# Patient Record
Sex: Male | Born: 1995 | Race: Black or African American | Hispanic: No | Smoking: Current every day smoker
Health system: Southern US, Community
[De-identification: ages and names within clinical notes are randomized; demographics above are authoritative.]

## PROBLEM LIST (undated history)

## (undated) DIAGNOSIS — G43909 Migraine, unspecified, not intractable, without status migrainosus: Secondary | ICD-10-CM

## (undated) DIAGNOSIS — K8020 Calculus of gallbladder without cholecystitis without obstruction: Secondary | ICD-10-CM

## (undated) HISTORY — PX: NO PAST SURGERIES: SHX2092

---
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
## 2015-04-27 ENCOUNTER — Encounter: Payer: Self-pay | Admitting: *Deleted

## 2015-04-27 ENCOUNTER — Emergency Department (INDEPENDENT_AMBULATORY_CARE_PROVIDER_SITE_OTHER)
Admission: EM | Admit: 2015-04-27 | Discharge: 2015-04-27 | Disposition: A | Discharge status: Home / Self Care | Payer: Self-pay | Source: Home / Self Care | Attending: Family Medicine | Admitting: Family Medicine

## 2015-04-27 ENCOUNTER — Emergency Department (INDEPENDENT_AMBULATORY_CARE_PROVIDER_SITE_OTHER): Payer: Self-pay

## 2015-04-27 DIAGNOSIS — G43001 Migraine without aura, not intractable, with status migrainosus: Secondary | ICD-10-CM

## 2015-04-27 DIAGNOSIS — G43909 Migraine, unspecified, not intractable, without status migrainosus: Secondary | ICD-10-CM

## 2015-04-27 HISTORY — DX: Migraine, unspecified, not intractable, without status migrainosus: G43.909

## 2015-04-27 MED ORDER — KETOROLAC TROMETHAMINE 60 MG/2ML IM SOLN
60.0000 mg | Freq: Once | INTRAMUSCULAR | Status: AC
  Administered 2015-04-27: 60 mg via INTRAMUSCULAR

## 2015-04-27 MED ORDER — DEXAMETHASONE SODIUM PHOSPHATE 10 MG/ML IJ SOLN
10.0000 mg | Freq: Once | INTRAMUSCULAR | Status: AC
  Administered 2015-04-27: 10 mg via INTRAMUSCULAR

## 2015-04-27 NOTE — ED Provider Notes (Signed)
CSN: [id number]     Arrival date & time 04/27/15  1044 History   First MD Initiated Contact with Patient 04/27/15 1240     Chief Complaint  Patient presents with  . Headache      HPI Comments: Patient complains of a two week history of constant severe right side headache.  The headache awakens him at night, and he states that he has not slept for 28 hours.  He has had vague right arm intermittent tingling and blurred vision.  He has had nausea without vomiting.  He has taken Excedrin migraine without improvement.  No fevers, chills, and sweats.  Patient states that he has a history of "migraines," although he has never had a headache evaluation.  His usual headaches occur 2 to 3 times per month and only last about two hours, resolving spontaneously. No family history of migraines.  His mother has MS  Patient is a 19 y.o. male presenting with headaches. The history is provided by the patient.  Headache Pain location:  R parietal Quality:  Dull Radiates to:  R shoulder Onset quality:  Sudden Duration:  2 weeks Timing:  Constant Progression:  Unchanged Chronicity:  Recurrent Similar to prior headaches: yes   Context: activity and bright light   Relieved by:  Nothing Worsened by:  Light and activity Ineffective treatments: Excedrin Migraine tabs. Associated symptoms: blurred vision, nausea, paresthesias, photophobia and tingling   Associated symptoms: no abdominal pain, no congestion, no dizziness, no drainage, no ear pain, no eye pain, no facial pain, no fatigue, no fever, no focal weakness, no hearing loss, no loss of balance, no myalgias, no near-syncope, no neck pain, no neck stiffness, no numbness, no sinus pressure, no sore throat, no swollen glands, no syncope, no URI, no visual change, no vomiting and no weakness     Past Medical History  Diagnosis Date  . Migraine    History reviewed. No pertinent past surgical history. Family History  Problem Relation Age of Onset  .  Multiple sclerosis Mother   . Thyroid disease Mother    History  Substance Use Topics  . Smoking status: Never Smoker   . Smokeless tobacco: Not on file  . Alcohol Use: No    Review of Systems  Constitutional: Negative for fever and fatigue.  HENT: Negative for congestion, ear pain, hearing loss, postnasal drip, sinus pressure and sore throat.   Eyes: Positive for blurred vision and photophobia. Negative for pain.  Cardiovascular: Negative for syncope and near-syncope.  Gastrointestinal: Positive for nausea. Negative for vomiting and abdominal pain.  Musculoskeletal: Negative for myalgias, neck pain and neck stiffness.  Neurological: Positive for headaches and paresthesias. Negative for dizziness, focal weakness, weakness, numbness and loss of balance.  All other systems reviewed and are negative.   Allergies  Review of patient's allergies indicates no known allergies.  Home Medications   Prior to Admission medications   Medication Sig Start Date End Date Taking? Authorizing Provider  Aspirin-Acetaminophen-Caffeine (EXCEDRIN PO) Take by mouth.   Yes Historical Provider, MD  ibuprofen (ADVIL,MOTRIN) 200 MG tablet Take 200 mg by mouth every 6 (six) hours as needed.   Yes Historical Provider, MD   BP 136/83 mmHg  Pulse 69  Temp(Src) 97.7 F (36.5 C) (Oral)  Resp 18  Ht  (1.753 m)  Wt 195 lb (88.451 kg)  BMI 28.78 kg/m2  SpO2 99% Physical Exam Nursing notes and Vital Signs reviewed. Appearance:  Patient appears stated age, and in no acute distress  sitting in darkened room.  He is alert and oriented. Eyes:  Pupils are equal, round, and reactive to light and accomodation.  Extraocular movement is intact.  Conjunctivae are not inflamed.  Fundi benign.  Photophobia present. Ears:  Canals normal.  Tympanic membranes normal.  No TMJ tenderness Nose:  Mildly congested turbinates.  No sinus tenderness.  Pharynx:  Normal; tongue midline Neck:  Supple.  No adenopathy or  thyromegaly. Lungs:  Clear to auscultation.  Breath sounds are equal.  Heart:  Regular rate and rhythm without murmurs, rubs, or gallops.  Abdomen:  Nontender without masses or hepatosplenomegaly.  Bowel sounds are present.  No CVA or flank tenderness.  Extremities:  No edema.  No calf tenderness Skin:  No rash present. Neurologic:  Cranial nerves 2 through 12 are normal.  Patellar, achilles, and elbow reflexes are normal.  Cerebellar function is intact (finger-to-nose and rapid alternating hand movement).  Gait and station are normal.  Romberg positive.    ED Course  Procedures  none    Imaging Review Ct Head Wo Contrast  04/27/2015   CLINICAL DATA:  Migraine for 2-3 weeks.  Blurred vision.  EXAM: CT HEAD WITHOUT CONTRAST  TECHNIQUE: Contiguous axial images were obtained from the base of the skull through the vertex without intravenous contrast.  COMPARISON:  None.  FINDINGS: No acute intracranial abnormality. Specifically, no hemorrhage, hydrocephalus, mass lesion, acute infarction, or significant intracranial injury. No acute calvarial abnormality. Visualized paranasal sinuses and mastoids clear. Orbital soft tissues unremarkable.  IMPRESSION: Negative.   Electronically Signed   By: Charlett NoseKevin  Dover M.D.   On: 04/27/2015 13:28     MDM   1. Migraine without aura and with status migrainosus, not intractable    Toradol 60mg  IM, and Decadron 10mg  IM Followup with PCP for headache management.    Lattie HawStephen A Beese, MD 04/27/15 [phone]

## 2015-04-27 NOTE — Discharge Instructions (Signed)
Recurrent Migraine Headache °A migraine headache is very bad, throbbing pain on one or both sides of your head. Recurrent migraines keep coming back. Talk to your doctor about what things may bring on (trigger) your migraine headaches. °HOME CARE °· Only take medicines as told by your doctor. °· Lie down in a dark, quiet room when you have a migraine. °· Keep a journal to find out if certain things bring on migraine headaches. For example, write down: °¨ What you eat and drink. °¨ How much sleep you get. °¨ Any change to your diet or medicines. °· Lessen how much alcohol you drink. °· Quit smoking if you smoke. °· Get enough sleep. °· Lessen any stress in your life. °· Keep lights dim if bright lights bother you or make your migraines worse. °GET HELP IF: °· Medicine does not help your migraines. °· Your pain keeps coming back. °· You have a fever. °GET HELP RIGHT AWAY IF:  °· Your migraine becomes really bad. °· You have a stiff neck. °· You have trouble seeing. °· Your muscles are weak, or you lose muscle control. °· You lose your balance or have trouble walking. °· You feel like you will pass out (faint), or you pass out. °· You have really bad symptoms that are different than your first symptoms. °MAKE SURE YOU:  °· Understand these instructions. °· Will watch your condition. °· Will get help right away if you are not doing well or get worse. °Document Released: 07/24/2008 Document Revised: 10/20/2013 Document Reviewed: 06/22/2013 °Exit[employer]® Patient Information ©2015 [employer]. This information is not intended to replace advice given to you by your health care provider. Make sure you discuss any questions you have with your health care provider. ° °

## 2015-04-27 NOTE — ED Notes (Signed)
Pt c/o HA x 2 wks. Reports history of migraines. He also c/o RT arm numbness and blurry vision in his eyes bilaterally x 1 wk. He reports that he has not slept in 28 hours due to HA. Denies any recent head trauma.

## 2015-10-25 ENCOUNTER — Encounter (HOSPITAL_COMMUNITY): Payer: Self-pay | Admitting: Emergency Medicine

## 2015-10-25 ENCOUNTER — Emergency Department (HOSPITAL_COMMUNITY)
Admission: EM | Admit: 2015-10-25 | Discharge: 2015-10-25 | Disposition: A | Discharge status: Home / Self Care | Payer: Self-pay | Attending: Emergency Medicine | Admitting: Emergency Medicine

## 2015-10-25 DIAGNOSIS — S0101XA Laceration without foreign body of scalp, initial encounter: Secondary | ICD-10-CM | POA: Insufficient documentation

## 2015-10-25 DIAGNOSIS — Y998 Other external cause status: Secondary | ICD-10-CM | POA: Insufficient documentation

## 2015-10-25 DIAGNOSIS — G43909 Migraine, unspecified, not intractable, without status migrainosus: Secondary | ICD-10-CM | POA: Insufficient documentation

## 2015-10-25 DIAGNOSIS — S0191XA Laceration without foreign body of unspecified part of head, initial encounter: Secondary | ICD-10-CM

## 2015-10-25 DIAGNOSIS — Y9289 Other specified places as the place of occurrence of the external cause: Secondary | ICD-10-CM | POA: Insufficient documentation

## 2015-10-25 DIAGNOSIS — Y9301 Activity, walking, marching and hiking: Secondary | ICD-10-CM | POA: Insufficient documentation

## 2015-10-25 MED ORDER — IBUPROFEN 200 MG PO TABS
600.0000 mg | ORAL_TABLET | Freq: Once | ORAL | Status: AC
  Administered 2015-10-25: 600 mg via ORAL
  Filled 2015-10-25: qty 1

## 2015-10-25 MED ORDER — LIDOCAINE-EPINEPHRINE-TETRACAINE (LET) SOLUTION
3.0000 mL | Freq: Once | NASAL | Status: AC
  Administered 2015-10-25: 3 mL via TOPICAL
  Filled 2015-10-25: qty 3

## 2015-10-25 MED ORDER — LIDOCAINE-EPINEPHRINE (PF) 2 %-1:200000 IJ SOLN
10.0000 mL | Freq: Once | INTRAMUSCULAR | Status: AC
  Administered 2015-10-25: 10 mL
  Filled 2015-10-25: qty 20
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
## 2015-10-25 NOTE — Discharge Instructions (Signed)
Read the information below.  You may return to the Emergency Department at any time for worsening condition or any new symptoms that concern you.  Please go to your doctor, the urgent care, or the emergency department in 10 days for a wound check and staple removal.  If you develop redness, swelling, pus draining from the wound, or fevers greater than 100.4, return to the ER immediately for a recheck.     You have had a head injury which does not appear to require admission at this time. A concussion is a state of changed mental ability from trauma. SEEK IMMEDIATE MEDICAL ATTENTION IF: There is confusion or drowsiness (although children frequently become drowsy after injury).  You cannot awaken the injured person.  There is nausea (feeling sick to your stomach) or continued, forceful vomiting.  You notice dizziness or unsteadiness which is getting worse, or inability to walk.  You have convulsions or unconsciousness.  You experience severe, persistent headaches not relieved by Tylenol?. (Do not take aspirin as this impairs clotting abilities). Take other pain medications only as directed.  You cannot use arms or legs normally.  There are changes in pupil sizes. (This is the black center in the colored part of the eye)  There is clear or bloody discharge from the nose or ears.  Change in speech, vision, swallowing, or understanding.  Localized weakness, numbness, tingling, or change in bowel or bladder control.   Head Injury, Adult You have a head injury. Headaches and throwing up (vomiting) are common after a head injury. It should be easy to wake up from sleeping. Sometimes you must stay in the hospital. Most problems happen within the first 24 hours. Side effects may occur up to 7-10 days after the injury.  WHAT ARE THE TYPES OF HEAD INJURIES? Head injuries can be as minor as a bump. Some head injuries can be more severe. More severe head injuries include:  A jarring injury to the brain  (concussion).  A bruise of the brain (contusion). This mean there is bleeding in the brain that can cause swelling.  A cracked skull (skull fracture).  Bleeding in the brain that collects, clots, and forms a bump (hematoma). WHEN SHOULD I GET HELP RIGHT AWAY?   You are confused or sleepy.  You cannot be woken up.  You feel sick to your stomach (nauseous) or keep throwing up (vomiting).  Your dizziness or unsteadiness is getting worse.  You have very bad, lasting headaches that are not helped by medicine. Take medicines only as told by your doctor.  You cannot use your arms or legs like normal.  You cannot walk.  You notice changes in the black spots in the center of the colored part of your eye (pupil).  You have clear or bloody fluid coming from your nose or ears.  You have trouble seeing. During the next 24 hours after the injury, you must stay with someone who can watch you. This person should get help right away (call 911 in the [place] if you start to shake and are not able to control it (have seizures), you pass out, or you are unable to wake up. HOW CAN I PREVENT A HEAD INJURY IN THE FUTURE?  Wear seat belts.  Wear a helmet while bike riding and playing sports like football.  Stay away from dangerous activities around the house. WHEN CAN I RETURN TO NORMAL ACTIVITIES AND ATHLETICS? See your doctor before doing these activities. You should not do normal activities  or play contact sports until 1 week after the following symptoms have stopped:  Headache that does not go away.  Dizziness.  Poor attention.  Confusion.  Memory problems.  Sickness to your stomach or throwing up.  Tiredness.  Fussiness.  Bothered by bright lights or loud noises.  Anxiousness or depression.  Restless sleep. MAKE SURE YOU:   Understand these instructions.  Will watch your condition.  Will get help right away if you are not doing well or get worse.   This information is  not intended to replace advice given to you by your health care provider. Make sure you discuss any questions you have with your health care provider.   Document Released: 09/27/2008 Document Revised: 11/05/2014 Document Reviewed: 06/22/2013 Elsevier Interactive Patient Education 2016 [employer].  Stitches, [place], or Adhesive Wound Closure Doctors use stitches (sutures), staples, and certain glue (skin adhesives) to hold your skin together while it heals (wound closure). You may need this treatment after you have surgery or if you cut your skin accidentally. These methods help your skin heal more quickly. They also make it less likely that you will have a scar. WHAT ARE THE DIFFERENT KINDS OF WOUND CLOSURES? There are many options for wound closure. The one that your doctor uses depends on how deep and large your wound is. Adhesive Glue To use this glue to close a wound, your doctor holds the edges of the wound together and paints the glue on the surface of your skin. You may need more than one layer of glue. Then the wound may be covered with a light bandage (dressing). This type of skin closure may be used for small wounds that are not deep (superficial). Using glue for wound closure is less painful than other methods. It does not require a medicine that numbs the area. This method also leaves nothing to be removed. Adhesive glue is often used for children and on facial wounds. Adhesive glue cannot be used for wounds that are deep, uneven, or bleeding. It is not used inside of a wound.  Adhesive Strips These strips are made of sticky (adhesive), porous paper. They are placed across your skin edges like a regular adhesive bandage. You leave them on until they fall off. Adhesive strips may be used to close very superficial wounds. They may also be used along with sutures to improve closure of your skin edges.  Sutures Sutures are the oldest method of wound closure. Sutures can be made from  natural or synthetic materials. They can be made from a material that your body can break down as your wound heals (absorbable), or they can be made from a material that needs to be removed from your skin (nonabsorbable). They come in many different strengths and sizes. Your doctor attaches the sutures to a steel needle on one end. Sutures can be passed through your skin, or through the tissues beneath your skin. Then they are tied and cut. Your skin edges may be closed in one continuous stitch or in separate stitches. Sutures are strong and can be used for all kinds of wounds. Absorbable sutures may be used to close tissues under the skin. The disadvantage of sutures is that they may cause skin reactions that lead to infection. Nonabsorbable sutures need to be removed. Staples When surgical staples are used to close a wound, the edges of your skin on both sides of the wound are brought close together. A staple is placed across the wound, and an instrument secures  the edges together. Staples are often used to close surgical cuts (incisions). Staples are faster to use than sutures, and they cause less reaction from your skin. Staples need to be removed using a tool that bends the staples away from your skin. HOW DO I CARE FOR MY WOUND CLOSURE?  Take medicines only as told by your doctor.  If you were prescribed an antibiotic medicine for your wound, finish it all even if you start to feel better.  Use ointments or creams only as told by your doctor.  Wash your hands with soap and water before and after touching your wound.  Do not soak your wound in water. Do not take baths, swim, or use a hot tub until your doctor says it is okay.  Ask your doctor when you can start showering. Cover your wound if told by your doctor.  Do not take out your own sutures or staples.  Do not pick at your wound. Picking can cause an infection.  Keep all follow-up visits as told by your doctor. This is  important. HOW LONG WILL I HAVE MY WOUND CLOSURE?   Leave adhesive glue on your skin until the glue peels away.  Leave adhesive strips on your skin until they fall off.  Absorbable sutures will dissolve within several days.  Nonabsorbable sutures and staples must be removed. The location of the wound will determine how long they stay in. This can range from several days to a couple of weeks. WHEN SHOULD I SEEK HELP FOR MY WOUND CLOSURE? Contact your doctor if:  You have a fever.  You have chills.  You have redness, puffiness (swelling), or pain at the site of your wound.  You have fluid, blood, or pus coming from your wound.  There is a bad smell coming from your wound.  The skin edges of your wound start to separate after your sutures have been removed.  Your wound becomes thick, raised, and darker in color after your sutures come out (scarring).   This information is not intended to replace advice given to you by your health care provider. Make sure you discuss any questions you have with your health care provider.   Document Released: 08/12/2009 Document Revised: 11/05/2014 Document Reviewed: 03/24/2014 Elsevier Interactive Patient Education [employer]

## 2015-11-26 ENCOUNTER — Encounter (HOSPITAL_COMMUNITY): Payer: Self-pay | Admitting: Emergency Medicine

## 2015-11-26 ENCOUNTER — Emergency Department (HOSPITAL_COMMUNITY)
Admission: EM | Admit: 2015-11-26 | Discharge: 2015-11-26 | Disposition: A | Discharge status: Home / Self Care | Payer: Self-pay | Attending: Emergency Medicine | Admitting: Emergency Medicine

## 2015-11-26 DIAGNOSIS — Z7982 Long term (current) use of aspirin: Secondary | ICD-10-CM | POA: Insufficient documentation

## 2015-11-26 DIAGNOSIS — Z4802 Encounter for removal of sutures: Secondary | ICD-10-CM | POA: Insufficient documentation

## 2015-11-26 DIAGNOSIS — G43909 Migraine, unspecified, not intractable, without status migrainosus: Secondary | ICD-10-CM | POA: Insufficient documentation

## 2015-11-26 NOTE — ED Provider Notes (Signed)
CSN: [id number]     Arrival date & time 11/26/15  1135 History  By signing my name below, I, Phillis Haggis, attest that this documentation has been prepared under the direction and in the presence of Cheri Fowler, PA-C. Electronically Signed: Phillis Haggis, ED Scribe. 11/26/2015. 12:34 PM.  Chief Complaint  Patient presents with  . Suture / Staple Removal   The history is provided by the patient. No language interpreter was used.  HPI Comments: Willie Meyer is a 20 y.o. male who presents to the Emergency Department requesting a staple removal. Pt states that he has 5 staples in his head that were supposed to be removed two weeks ago. He reports one fell out yesterday.  Pt states that he did not get them removed because of the inclement weather. He denies fever, chills, nausea, or vomiting.  Past Medical History  Diagnosis Date  . Migraine    History reviewed. No pertinent past surgical history. Family History  Problem Relation Age of Onset  . Multiple sclerosis Mother   . Thyroid disease Mother    Social History  Substance Use Topics  . Smoking status: Never Smoker   . Smokeless tobacco: None  . Alcohol Use: No    Review of Systems  Constitutional: Negative for fever and chills.  Gastrointestinal: Negative for nausea and vomiting.  Skin: Positive for wound.  All other systems reviewed and are negative.  Allergies  Review of patient's allergies indicates no known allergies.  Home Medications   Prior to Admission medications   Medication Sig Start Date End Date Taking? Authorizing Provider  Aspirin-Acetaminophen-Caffeine (EXCEDRIN PO) Take by mouth.    Historical Provider, MD  ibuprofen (ADVIL,MOTRIN) 200 MG tablet Take 200 mg by mouth every 6 (six) hours as needed.    Historical Provider, MD   BP 105/89 mmHg  Pulse 80  Temp(Src) 97.7 F (36.5 C)  Resp 20  Ht  (1.753 m)  Wt 88.905 kg  BMI 28.93 kg/m2  SpO2 98% Physical Exam  Constitutional: He is  oriented to person, place, and time. He appears well-developed and well-nourished.  HENT:  Head: Atraumatic.  Eyes: Conjunctivae are normal. No scleral icterus.  Neck: No tracheal deviation present.  Pulmonary/Chest: Effort normal. No respiratory distress.  Abdominal: He exhibits no distension.  Musculoskeletal: Normal range of motion.  Neurological: He is alert and oriented to person, place, and time.  Skin: Skin is warm and dry. No erythema.  Well appearing and healing wound with 4 staples in place along left parietal scalp without tenderness, erythema, or drainage.    Psychiatric: He has a normal mood and affect. His behavior is normal.    ED Course  Procedures (including critical care time) DIAGNOSTIC STUDIES: Oxygen Saturation is 98% on RA, normal by my interpretation.    COORDINATION OF CARE: 12:32 PM-Discussed treatment plan which includes staple removal with pt at bedside and pt agreed to plan.   STAPLE REMOVAL Performed by: Cheri Fowler, PA-C Authorized by: Arby Barrette, MD Consent: Verbal consent obtained. Consent given by: patient Required items: required blood products, implants, devices, and special equipment available  Time out: Immediately prior to procedure a "time out" was called to verify the correct patient, procedure, equipment, support staff and site/side marked as required. Location: left parietal scalp Wound Appearance: clean  Staples Removed: 4 Post-removal: none Patient tolerance: Patient tolerated the procedure well with no immediate complications.    Labs Review Labs Reviewed - No data to display  Imaging Review  No results found. I have personally reviewed and evaluated these images and lab results as part of my medical decision-making.   EKG Interpretation None      MDM  Staple removal   Pt to ER for staple/suture removal and wound check as above. Procedure tolerated well. Vitals normal, no signs of infection. Scar minimization & return  precautions given at dc.   Final diagnoses:  Encounter for staple removal    I personally performed the services described in this documentation, which was scribed in my presence. The recorded information has been reviewed and is accurate.    Cheri Fowler, PA-C 11/26/15 1238  Arby Barrette, MD 12/04/15 [phone]

## 2015-11-26 NOTE — Discharge Instructions (Signed)

## 2015-11-26 NOTE — ED Notes (Signed)
Pt here for staple removal in head. Pt reports that they were supposed to be removed 2 weeks ago.

## 2016-05-05 ENCOUNTER — Emergency Department (HOSPITAL_COMMUNITY)
Admission: EM | Admit: 2016-05-05 | Discharge: 2016-05-05 | Disposition: A | Discharge status: Home / Self Care | Payer: Self-pay | Attending: Dermatology | Admitting: Dermatology

## 2016-05-05 ENCOUNTER — Encounter (HOSPITAL_COMMUNITY): Payer: Self-pay

## 2016-05-05 DIAGNOSIS — Z5321 Procedure and treatment not carried out due to patient leaving prior to being seen by health care provider: Secondary | ICD-10-CM | POA: Insufficient documentation

## 2016-05-05 DIAGNOSIS — R531 Weakness: Secondary | ICD-10-CM | POA: Insufficient documentation

## 2016-05-05 LAB — CBC
HCT: 49.1 % (ref 39.0–52.0)
HEMOGLOBIN: 17.1 g/dL — AB (ref 13.0–17.0)
MCH: 30.3 pg (ref 26.0–34.0)
MCHC: 34.8 g/dL (ref 30.0–36.0)
MCV: 86.9 fL (ref 78.0–100.0)
Platelets: 265 10*3/uL (ref 150–400)
RBC: 5.65 MIL/uL (ref 4.22–5.81)
RDW: 12.8 % (ref 11.5–15.5)
WBC: 5.5 10*3/uL (ref 4.0–10.5)

## 2016-05-05 LAB — BASIC METABOLIC PANEL
ANION GAP: 7 (ref 5–15)
BUN: 8 mg/dL (ref 6–20)
CALCIUM: 9.8 mg/dL (ref 8.9–10.3)
CHLORIDE: 102 mmol/L (ref 101–111)
CO2: 28 mmol/L (ref 22–32)
Creatinine, Ser: 1.11 mg/dL (ref 0.61–1.24)
GFR calc Af Amer: >60 mL/min (ref >60)
GFR calc non Af Amer: >60 mL/min (ref >60)
GLUCOSE: 102 mg/dL — AB (ref 65–99)
POTASSIUM: 4.3 mmol/L (ref 3.5–5.1)
Sodium: 137 mmol/L (ref 135–145)

## 2016-05-05 NOTE — ED Notes (Signed)
Called for pt. X2 , pt. Did not answer

## 2016-05-05 NOTE — ED Notes (Addendum)
Patient here with intermittent weakness, headaches and fatigue for 1 year. Has had CT for same and was told negative. Patient concerned because his mother was diagnosed with MS at an early age, no neuro deficits on arrival. [facility] that he has migra[facility].

## 2016-08-29 DIAGNOSIS — K8020 Calculus of gallbladder without cholecystitis without obstruction: Secondary | ICD-10-CM

## 2016-08-29 HISTORY — DX: Calculus of gallbladder without cholecystitis without obstruction: K80.20

## 2016-09-17 ENCOUNTER — Observation Stay (HOSPITAL_COMMUNITY): Payer: Self-pay | Admitting: Certified Registered Nurse Anesthetist

## 2016-09-17 ENCOUNTER — Encounter (HOSPITAL_COMMUNITY): Payer: Self-pay | Admitting: Emergency Medicine

## 2016-09-17 ENCOUNTER — Inpatient Hospital Stay (HOSPITAL_COMMUNITY)
Admission: EM | Admit: 2016-09-17 | Discharge: 2016-09-19 | DRG: 419 | Disposition: A | Discharge status: Home / Self Care | Payer: Self-pay | Attending: Surgery | Admitting: Surgery

## 2016-09-17 ENCOUNTER — Emergency Department (HOSPITAL_COMMUNITY): Payer: Self-pay

## 2016-09-17 ENCOUNTER — Encounter (HOSPITAL_COMMUNITY): Admission: EM | Disposition: A | Discharge status: Home / Self Care | Payer: Self-pay | Source: Home / Self Care

## 2016-09-17 DIAGNOSIS — K819 Cholecystitis, unspecified: Secondary | ICD-10-CM

## 2016-09-17 DIAGNOSIS — F17200 Nicotine dependence, unspecified, uncomplicated: Secondary | ICD-10-CM | POA: Diagnosis present

## 2016-09-17 DIAGNOSIS — K8010 Calculus of gallbladder with chronic cholecystitis without obstruction: Secondary | ICD-10-CM | POA: Diagnosis present

## 2016-09-17 DIAGNOSIS — Z8349 Family history of other endocrine, nutritional and metabolic diseases: Secondary | ICD-10-CM

## 2016-09-17 DIAGNOSIS — K8012 Calculus of gallbladder with acute and chronic cholecystitis without obstruction: Principal | ICD-10-CM | POA: Diagnosis present

## 2016-09-17 DIAGNOSIS — R1011 Right upper quadrant pain: Secondary | ICD-10-CM

## 2016-09-17 DIAGNOSIS — Z820 Family history of epilepsy and other diseases of the nervous system: Secondary | ICD-10-CM

## 2016-09-17 HISTORY — DX: Calculus of gallbladder without cholecystitis without obstruction: K80.20

## 2016-09-17 HISTORY — PX: CHOLECYSTECTOMY: SHX55

## 2016-09-17 LAB — COMPREHENSIVE METABOLIC PANEL
ALBUMIN: 3.8 g/dL (ref 3.5–5.0)
ALK PHOS: 88 U/L (ref 38–126)
ALT: 95 U/L — AB (ref 17–63)
ANION GAP: 7 (ref 5–15)
AST: 46 U/L — AB (ref 15–41)
BUN: 8 mg/dL (ref 6–20)
CALCIUM: 8.9 mg/dL (ref 8.9–10.3)
CO2: 28 mmol/L (ref 22–32)
CREATININE: 1.1 mg/dL (ref 0.61–1.24)
Chloride: 105 mmol/L (ref 101–111)
GFR calc Af Amer: >60 mL/min (ref >60)
GFR calc non Af Amer: >60 mL/min (ref >60)
GLUCOSE: 100 mg/dL — AB (ref 65–99)
Potassium: 3.6 mmol/L (ref 3.5–5.1)
SODIUM: 140 mmol/L (ref 135–145)
Total Bilirubin: 0.5 mg/dL (ref 0.3–1.2)
Total Protein: 6.8 g/dL (ref 6.5–8.1)

## 2016-09-17 LAB — URINALYSIS, ROUTINE W REFLEX MICROSCOPIC
BILIRUBIN URINE: NEGATIVE
Glucose, UA: NEGATIVE mg/dL
HGB URINE DIPSTICK: NEGATIVE
Ketones, ur: NEGATIVE mg/dL
Leukocytes, UA: NEGATIVE
Nitrite: NEGATIVE
PH: 6 (ref 5.0–8.0)
Protein, ur: NEGATIVE mg/dL
SPECIFIC GRAVITY, URINE: 1.024 (ref 1.005–1.030)

## 2016-09-17 LAB — CBC
HCT: 45.2 % (ref 39.0–52.0)
HEMOGLOBIN: 15.4 g/dL (ref 13.0–17.0)
MCH: 30.3 pg (ref 26.0–34.0)
MCHC: 34.1 g/dL (ref 30.0–36.0)
MCV: 88.8 fL (ref 78.0–100.0)
Platelets: 231 10*3/uL (ref 150–400)
RBC: 5.09 MIL/uL (ref 4.22–5.81)
RDW: 13.6 % (ref 11.5–15.5)
WBC: 6.7 10*3/uL (ref 4.0–10.5)

## 2016-09-17 LAB — SURGICAL PCR SCREEN
MRSA, PCR: NEGATIVE
STAPHYLOCOCCUS AUREUS: NEGATIVE

## 2016-09-17 LAB — LIPASE, BLOOD: Lipase: 23 U/L (ref 11–51)

## 2016-09-17 SURGERY — LAPAROSCOPIC CHOLECYSTECTOMY WITH INTRAOPERATIVE CHOLANGIOGRAM
Anesthesia: General | Site: Abdomen

## 2016-09-17 MED ORDER — PROPOFOL 10 MG/ML IV BOLUS
INTRAVENOUS | Status: AC
Start: 2016-09-17 — End: 2016-09-17
  Filled 2016-09-17: qty 20

## 2016-09-17 MED ORDER — DEXTROSE 5 % IV SOLN
2.0000 g | INTRAVENOUS | Status: DC
  Administered 2016-09-17 – 2016-09-18 (×2): 2 g via INTRAVENOUS
  Filled 2016-09-17 (×2): qty 2

## 2016-09-17 MED ORDER — ONDANSETRON HCL 4 MG/2ML IJ SOLN
INTRAMUSCULAR | Status: AC
  Filled 2016-09-17: qty 4

## 2016-09-17 MED ORDER — NEOSTIGMINE METHYLSULFATE 10 MG/10ML IV SOLN
INTRAVENOUS | Status: DC | PRN
  Administered 2016-09-17: 4 mg via INTRAVENOUS

## 2016-09-17 MED ORDER — HYDROMORPHONE HCL 1 MG/ML IJ SOLN
INTRAMUSCULAR | Status: AC
  Filled 2016-09-17: qty 0.5

## 2016-09-17 MED ORDER — GLYCOPYRROLATE 0.2 MG/ML IJ SOLN
INTRAMUSCULAR | Status: DC | PRN
  Administered 2016-09-17: .6 mg via INTRAVENOUS

## 2016-09-17 MED ORDER — SUCCINYLCHOLINE CHLORIDE 20 MG/ML IJ SOLN
INTRAMUSCULAR | Status: DC | PRN
  Administered 2016-09-17: 100 mg via INTRAVENOUS

## 2016-09-17 MED ORDER — PROPOFOL 10 MG/ML IV BOLUS
INTRAVENOUS | Status: DC | PRN
  Administered 2016-09-17: 200 mg via INTRAVENOUS

## 2016-09-17 MED ORDER — LIDOCAINE HCL (CARDIAC) 20 MG/ML IV SOLN
INTRAVENOUS | Status: DC | PRN
  Administered 2016-09-17: 100 mg via INTRAVENOUS

## 2016-09-17 MED ORDER — ROCURONIUM BROMIDE 100 MG/10ML IV SOLN
INTRAVENOUS | Status: DC | PRN
  Administered 2016-09-17: 10 mg via INTRAVENOUS
  Administered 2016-09-17: 30 mg via INTRAVENOUS

## 2016-09-17 MED ORDER — 0.9 % SODIUM CHLORIDE (POUR BTL) OPTIME
TOPICAL | Status: DC | PRN
  Administered 2016-09-17: 1000 mL

## 2016-09-17 MED ORDER — POTASSIUM CHLORIDE IN NACL 20-0.9 MEQ/L-% IV SOLN
INTRAVENOUS | Status: DC
  Administered 2016-09-17: 08:00:00 via INTRAVENOUS
  Filled 2016-09-17: qty 1000

## 2016-09-17 MED ORDER — DEXTROSE-NACL 5-0.9 % IV SOLN
INTRAVENOUS | Status: DC
  Administered 2016-09-17: 18:00:00 via INTRAVENOUS

## 2016-09-17 MED ORDER — ONDANSETRON HCL 4 MG/2ML IJ SOLN
INTRAMUSCULAR | Status: DC | PRN
  Administered 2016-09-17: 4 mg via INTRAVENOUS

## 2016-09-17 MED ORDER — ONDANSETRON HCL 4 MG/2ML IJ SOLN
4.0000 mg | Freq: Four times a day (QID) | INTRAMUSCULAR | Status: DC | PRN
  Administered 2016-09-19: 4 mg via INTRAVENOUS
  Filled 2016-09-17: qty 2

## 2016-09-17 MED ORDER — MIDAZOLAM HCL 2 MG/2ML IJ SOLN
INTRAMUSCULAR | Status: AC
  Filled 2016-09-17: qty 2

## 2016-09-17 MED ORDER — SODIUM CHLORIDE 0.9 % IR SOLN
Status: DC | PRN
  Administered 2016-09-17: 1000 mL

## 2016-09-17 MED ORDER — ONDANSETRON HCL 4 MG/2ML IJ SOLN: 4.0000 mg | Freq: Four times a day (QID) | INTRAMUSCULAR | Status: DC | PRN

## 2016-09-17 MED ORDER — BUPIVACAINE HCL (PF) 0.25 % IJ SOLN
INTRAMUSCULAR | Status: AC
Start: 2016-09-17 — End: 2016-09-17
  Filled 2016-09-17: qty 30

## 2016-09-17 MED ORDER — MIDAZOLAM HCL 5 MG/5ML IJ SOLN
INTRAMUSCULAR | Status: DC | PRN
  Administered 2016-09-17: 2 mg via INTRAVENOUS

## 2016-09-17 MED ORDER — FENTANYL CITRATE (PF) 100 MCG/2ML IJ SOLN
INTRAMUSCULAR | Status: DC | PRN
  Administered 2016-09-17: 100 ug via INTRAVENOUS
  Administered 2016-09-17: 50 ug via INTRAVENOUS

## 2016-09-17 MED ORDER — OXYCODONE HCL 5 MG PO TABS
ORAL_TABLET | ORAL | Status: AC
  Filled 2016-09-17: qty 2

## 2016-09-17 MED ORDER — MORPHINE SULFATE (PF) 4 MG/ML IV SOLN
4.0000 mg | Freq: Once | INTRAVENOUS | Status: AC
  Administered 2016-09-17: 4 mg via INTRAVENOUS
  Filled 2016-09-17: qty 1

## 2016-09-17 MED ORDER — OXYCODONE HCL 5 MG PO TABS
5.0000 mg | ORAL_TABLET | ORAL | Status: DC | PRN
  Administered 2016-09-17 – 2016-09-19 (×11): 10 mg via ORAL
  Filled 2016-09-17 (×10): qty 2

## 2016-09-17 MED ORDER — FENTANYL CITRATE (PF) 100 MCG/2ML IJ SOLN
INTRAMUSCULAR | Status: AC
  Filled 2016-09-17: qty 4

## 2016-09-17 MED ORDER — ENOXAPARIN SODIUM 40 MG/0.4ML ~~LOC~~ SOLN
40.0000 mg | SUBCUTANEOUS | Status: DC
  Administered 2016-09-18 – 2016-09-19 (×2): 40 mg via SUBCUTANEOUS
  Filled 2016-09-17 (×2): qty 0.4

## 2016-09-17 MED ORDER — BUPIVACAINE HCL 0.25 % IJ SOLN
INTRAMUSCULAR | Status: DC | PRN
  Administered 2016-09-17: 6 mL

## 2016-09-17 MED ORDER — ONDANSETRON HCL 4 MG/2ML IJ SOLN
4.0000 mg | Freq: Once | INTRAMUSCULAR | Status: AC
  Administered 2016-09-17: 4 mg via INTRAVENOUS
  Filled 2016-09-17: qty 2

## 2016-09-17 MED ORDER — HYDROMORPHONE HCL 2 MG/ML IJ SOLN
1.0000 mg | INTRAMUSCULAR | Status: DC | PRN
  Administered 2016-09-17 – 2016-09-18 (×4): 2 mg via INTRAVENOUS
  Filled 2016-09-17 (×4): qty 1

## 2016-09-17 MED ORDER — HYDROMORPHONE HCL 1 MG/ML IJ SOLN
0.2500 mg | INTRAMUSCULAR | Status: DC | PRN
  Administered 2016-09-17 (×2): 0.5 mg via INTRAVENOUS

## 2016-09-17 MED ORDER — ONDANSETRON 4 MG PO TBDP: 4.0000 mg | ORAL_TABLET | Freq: Four times a day (QID) | ORAL | Status: DC | PRN

## 2016-09-17 MED ORDER — PROPOFOL 10 MG/ML IV BOLUS
INTRAVENOUS | Status: AC
  Filled 2016-09-17: qty 20

## 2016-09-17 MED ORDER — HYDROMORPHONE HCL 2 MG/ML IJ SOLN
1.0000 mg | INTRAMUSCULAR | Status: DC | PRN
  Administered 2016-09-17 (×3): 1 mg via INTRAVENOUS
  Filled 2016-09-17 (×3): qty 1

## 2016-09-17 MED ORDER — LACTATED RINGERS IV SOLN
INTRAVENOUS | Status: DC
  Administered 2016-09-17 (×2): via INTRAVENOUS

## 2016-09-17 MED ORDER — SODIUM CHLORIDE 0.9 % IV BOLUS (SEPSIS)
1000.0000 mL | Freq: Once | INTRAVENOUS | Status: AC
  Administered 2016-09-17: 1000 mL via INTRAVENOUS

## 2016-09-17 SURGICAL SUPPLY — 50 items
APL SKNCLS STERI-STRIP NONHPOA (GAUZE/BANDAGES/DRESSINGS) ×1
APPLIER CLIP 5 13 M/L LIGAMAX5 (MISCELLANEOUS)
APR CLP MED LRG 5 ANG JAW (MISCELLANEOUS)
BAG SPEC RTRVL LRG 6X4 10 (ENDOMECHANICALS) ×1
BENZOIN TINCTURE PRP APPL 2/3 (GAUZE/BANDAGES/DRESSINGS) ×3 IMPLANT
CANISTER SUCTION 2500CC (MISCELLANEOUS) ×3 IMPLANT
CHLORAPREP W/TINT 26ML (MISCELLANEOUS) ×3 IMPLANT
CLIP APPLIE 5 13 M/L LIGAMAX5 (MISCELLANEOUS) IMPLANT
CLIP LIGATING HEMO O LOK GREEN (MISCELLANEOUS) ×3 IMPLANT
CLOSURE WOUND 1/2 X4 (GAUZE/BANDAGES/DRESSINGS) ×1
COVER MAYO STAND STRL (DRAPES) ×3 IMPLANT
COVER SURGICAL LIGHT HANDLE (MISCELLANEOUS) ×3 IMPLANT
COVER TRANSDUCER ULTRASND (DRAPES) IMPLANT
DEVICE TROCAR PUNCTURE CLOSURE (ENDOMECHANICALS) ×3 IMPLANT
DRAPE C-ARM 42X72 X-RAY (DRAPES) ×3 IMPLANT
ELECT REM PT RETURN 9FT ADLT (ELECTROSURGICAL) ×3
ELECTRODE REM PT RTRN 9FT ADLT (ELECTROSURGICAL) ×1 IMPLANT
GAUZE SPONGE 2X2 8PLY STRL LF (GAUZE/BANDAGES/DRESSINGS) ×1 IMPLANT
GLOVE BIO SURGEON STRL SZ7.5 (GLOVE) ×3 IMPLANT
GLOVE BIOGEL PI IND STRL 7.0 (GLOVE) IMPLANT
GLOVE BIOGEL PI INDICATOR 7.0 (GLOVE) ×6
GLOVE ECLIPSE 7.0 STRL STRAW (GLOVE) ×2 IMPLANT
GLOVE SURG SS PI 7.0 STRL IVOR (GLOVE) ×2 IMPLANT
GOWN STRL REUS W/ TWL LRG LVL3 (GOWN DISPOSABLE) ×2 IMPLANT
GOWN STRL REUS W/ TWL XL LVL3 (GOWN DISPOSABLE) ×1 IMPLANT
GOWN STRL REUS W/TWL LRG LVL3 (GOWN DISPOSABLE) ×9
GOWN STRL REUS W/TWL XL LVL3 (GOWN DISPOSABLE) ×3
IV CATH 14GX2 1/4 (CATHETERS) ×3 IMPLANT
KIT BASIN OR (CUSTOM PROCEDURE TRAY) ×3 IMPLANT
KIT ROOM TURNOVER OR (KITS) ×3 IMPLANT
NDL INSUFFLATION 14GA 120MM (NEEDLE) ×1 IMPLANT
NEEDLE INSUFFLATION 14GA 120MM (NEEDLE) ×3 IMPLANT
NS IRRIG 1000ML POUR BTL (IV SOLUTION) ×3 IMPLANT
PAD ARMBOARD 7.5X6 YLW CONV (MISCELLANEOUS) ×6 IMPLANT
POUCH SPECIMEN RETRIEVAL 10MM (ENDOMECHANICALS) ×2 IMPLANT
SCISSORS LAP 5X35 DISP (ENDOMECHANICALS) ×3 IMPLANT
SET CHOLANGIOGRAPHY FRANKLIN (SET/KITS/TRAYS/PACK) ×3 IMPLANT
SET IRRIG TUBING LAPAROSCOPIC (IRRIGATION / IRRIGATOR) ×3 IMPLANT
SLEEVE ENDOPATH XCEL 5M (ENDOMECHANICALS) ×3 IMPLANT
SPECIMEN JAR SMALL (MISCELLANEOUS) ×3 IMPLANT
SPONGE GAUZE 2X2 STER 10/PKG (GAUZE/BANDAGES/DRESSINGS) ×2
STRIP CLOSURE SKIN 1/2X4 (GAUZE/BANDAGES/DRESSINGS) ×2 IMPLANT
SUT MNCRL AB 3-0 PS2 18 (SUTURE) ×3 IMPLANT
TAPE CLOTH SURG 4X10 WHT LF (GAUZE/BANDAGES/DRESSINGS) ×2 IMPLANT
TOWEL OR 17X24 6PK STRL BLUE (TOWEL DISPOSABLE) ×3 IMPLANT
TOWEL OR 17X26 10 PK STRL BLUE (TOWEL DISPOSABLE) ×3 IMPLANT
TRAY LAPAROSCOPIC MC (CUSTOM PROCEDURE TRAY) ×3 IMPLANT
TROCAR XCEL NON-BLD 11X100MML (ENDOMECHANICALS) ×3 IMPLANT
TROCAR XCEL NON-BLD 5MMX100MML (ENDOMECHANICALS) ×6 IMPLANT
TUBING INSUFFLATION (TUBING) ×3 IMPLANT

## 2016-09-17 NOTE — Transfer of Care (Signed)
Immediate Anesthesia Transfer of Care Note  Patient: Willie Meyer  Procedure(s) Performed: Procedure(s): LAPAROSCOPIC CHOLECYSTECTOMY (N/A)  Patient Location: PACU  Anesthesia Type:General  Level of Consciousness: awake, alert , oriented and patient cooperative  Airway & Oxygen Therapy: Patient Spontanous Breathing  Post-op Assessment: Report given to RN and Post -op Vital signs reviewed and stable  Post vital signs: Reviewed and stable  Last Vitals:  Vitals:   09/17/16 0700 09/17/16 0819  BP: 117/65 104/62  Pulse: (!) 59 87  Resp:  17  Temp:  36.4 C    Last Pain:  Vitals:   09/17/16 1352  TempSrc:   PainSc: Asleep      Patients Stated Pain Goal: 3 (09/17/16 0817)  Complications: No apparent anesthesia complications

## 2016-09-17 NOTE — ED Notes (Signed)
Attempted report 

## 2016-09-17 NOTE — H&P (Signed)
Willie Meyer is an 20 y.o. male.   Chief Complaint: Abdominal pain HPI: This is a 20 year old gentleman who presents with epigastric abdominal pain. The patient reports that it started suddenly yesterday. It is now moved to the right upper quadrant. He had nausea but no vomiting. He has no previous history of similar abdominal pain. After presenting to the ER, he has had an ultrasound showing findings consistent with acute cholecystitis with cholelithiasis. He is otherwise healthy without complaints.  Past Medical History:  Diagnosis Date  . Migraine     History reviewed. No pertinent surgical history.  Family History  Problem Relation Age of Onset  . Multiple sclerosis Mother   . Thyroid disease Mother    Social History:  reports that he has been smoking.  He has never used smokeless tobacco. He reports that he drinks alcohol. He reports that he uses drugs, including Marijuana.  Allergies: No Known Allergies   (Not in a hospital admission)  Results for orders placed or performed during the hospital encounter of 09/17/16 (from the past 48 hour(s))  Lipase, blood     Status: None   Collection Time: 09/17/16 12:46 AM  Result Value Ref Range   Lipase 23 11 - 51 U/L  Comprehensive metabolic panel     Status: Abnormal   Collection Time: 09/17/16 12:46 AM  Result Value Ref Range   Sodium 140 135 - 145 mmol/L   Potassium 3.6 3.5 - 5.1 mmol/L   Chloride 105 101 - 111 mmol/L   CO2 28 22 - 32 mmol/L   Glucose, Bld 100 (H) 65 - 99 mg/dL   BUN 8 6 - 20 mg/dL   Creatinine, Ser 1.10 0.61 - 1.24 mg/dL   Calcium 8.9 8.9 - 10.3 mg/dL   Total Protein 6.8 6.5 - 8.1 g/dL   Albumin 3.8 3.5 - 5.0 g/dL   AST 46 (H) 15 - 41 U/L   ALT 95 (H) 17 - 63 U/L   Alkaline Phosphatase 88 38 - 126 U/L   Total Bilirubin 0.5 0.3 - 1.2 mg/dL   GFR calc non Af Amer >60 >60 mL/min   GFR calc Af Amer >60 >60 mL/min    Comment: (NOTE) The eGFR has been calculated using the CKD EPI equation. This  calculation has not been validated in all clinical situations. eGFR's persistently <60 mL/min signify possible Chronic Kidney Disease.    Anion gap 7 5 - 15  CBC     Status: None   Collection Time: 09/17/16 12:46 AM  Result Value Ref Range   WBC 6.7 4.0 - 10.5 K/uL   RBC 5.09 4.22 - 5.81 MIL/uL   Hemoglobin 15.4 13.0 - 17.0 g/dL   HCT 45.2 39.0 - 52.0 %   MCV 88.8 78.0 - 100.0 fL   MCH 30.3 26.0 - 34.0 pg   MCHC 34.1 30.0 - 36.0 g/dL   RDW 13.6 11.5 - 15.5 %   Platelets 231 150 - 400 K/uL  Urinalysis, Routine w reflex microscopic     Status: None   Collection Time: 09/17/16 12:46 AM  Result Value Ref Range   Color, Urine YELLOW YELLOW   APPearance CLEAR CLEAR   Specific Gravity, Urine 1.024 1.005 - 1.030   pH 6.0 5.0 - 8.0   Glucose, UA NEGATIVE NEGATIVE mg/dL   Hgb urine dipstick NEGATIVE NEGATIVE   Bilirubin Urine NEGATIVE NEGATIVE   Ketones, ur NEGATIVE NEGATIVE mg/dL   Protein, ur NEGATIVE NEGATIVE mg/dL   Nitrite NEGATIVE  NEGATIVE   Leukocytes, UA NEGATIVE NEGATIVE    Comment: MICROSCOPIC NOT DONE ON URINES WITH NEGATIVE PROTEIN, BLOOD, LEUKOCYTES, NITRITE, OR GLUCOSE <1000 mg/dL.   US Abdomen Limited Ruq  Result Date: 09/17/2016 CLINICAL DATA:  Right upper quadrant pain. EXAM: US ABDOMEN LIMITED - RIGHT UPPER QUADRANT COMPARISON:  None. FINDINGS: Gallbladder: Stone in the gallbladder neck measuring 2.3 cm. Mild gallbladder wall thickening and edema at 3.6 mm. Murphy's sign is positive. Common bile duct: Diameter: 2.5 mm, normal Liver: No focal lesion identified. Within normal limits in parenchymal echogenicity. IMPRESSION: Cholelithiasis with gallbladder wall thickening and positive Murphy's sign. Changes are consistent with acute cholecystitis in the appropriate clinical setting. Electronically Signed   By: Lucienne Capers M.D.   On: 09/17/2016 04:19    Review of Systems  Constitutional: Negative for chills and fever.  HENT: Negative for hearing loss and sore throat.    Eyes: Negative for blurred vision.  Respiratory: Negative for cough and shortness of breath.   Cardiovascular: Negative for chest pain and palpitations.  Gastrointestinal: Positive for abdominal pain and vomiting. Negative for diarrhea and nausea.  Genitourinary: Negative for dysuria and urgency.  Skin: Negative for rash.  Neurological: Negative for weakness.  All other systems reviewed and are negative.   Blood pressure 135/94, pulse 70, temperature 97.6 F (36.4 C), temperature source Oral, resp. rate 18, SpO2 100 %. Physical Exam  Constitutional: He is oriented to person, place, and time. He appears well-developed and well-nourished. No distress.  Lying on his side, mildly uncomfortable in appearance  HENT:  Head: Normocephalic and atraumatic.  Right Ear: External ear normal.  Left Ear: External ear normal.  Nose: Nose normal.  Mouth/Throat: Oropharynx is clear and moist. No oropharyngeal exudate.  Eyes: Conjunctivae are normal. Pupils are equal, round, and reactive to light. No scleral icterus.  Neck: Normal range of motion. No tracheal deviation present.  Cardiovascular: Normal rate, regular rhythm, normal heart sounds and intact distal pulses.   No murmur heard. Respiratory: Effort normal and breath sounds normal. No respiratory distress. He has no wheezes. He exhibits no tenderness.  GI: Soft. He exhibits no distension. There is tenderness. There is guarding.  There is tenderness with guarding in the right upper quadrant  Musculoskeletal: Normal range of motion. He exhibits no edema, tenderness or deformity.  Lymphadenopathy:    He has no cervical adenopathy.  Neurological: He is alert and oriented to person, place, and time.  Skin: Skin is warm and dry. No rash noted. He is not diaphoretic. No erythema.  Psychiatric: His behavior is normal. Judgment normal.     Assessment/Plan Acute cholecystitis with cholelithiasis  I discussed the diagnosis with the patient in  detail. Admission for IV antibiotics and cholecystectomy is recommended. His white blood count is normal but there are findings consistent with cholecystitis on exam and ultrasound and there is a stone in the gallbladder neck.  I discussed the surgical procedure with him. He seems to understand and agree with the admission.  Harl Bowie, MD 09/17/2016, 6:11 AM

## 2016-09-17 NOTE — Discharge Instructions (Signed)
Laparoscopic Cholecystectomy, Care After °This sheet gives you information about how to care for yourself after your procedure. Your health care provider may also give you more specific instructions. If you have problems or questions, contact your health care provider. °What can I expect after the procedure? °After the procedure, it is common to have: °· Pain at your incision sites. You will be given medicines to control this pain. °· Mild nausea or vomiting. °· Bloating and possible shoulder pain from the air-like gas that was used during the procedure. °Follow these instructions at home: °Incision care  ° °· Follow instructions from your health care provider about how to take care of your incisions. Make sure you: °¨ Wash your hands with soap and water before you change your bandage (dressing). If soap and water are not available, use hand sanitizer. °¨ Change your dressing as told by your health care provider. °¨ Leave stitches (sutures), skin glue, or adhesive strips in place. These skin closures may need to be in place for 2 weeks or longer. If adhesive strip edges start to loosen and curl up, you may trim the loose edges. Do not remove adhesive strips completely unless your health care provider tells you to do that. °· Do not take baths, swim, or use a hot tub until your health care provider approves. Ask your health care provider if you can take showers. You may only be allowed to take sponge baths for bathing. °· Check your incision area every day for signs of infection. Check for: °¨ More redness, swelling, or pain. °¨ More fluid or blood. °¨ Warmth. °¨ Pus or a bad smell. °Activity  °· Do not drive or use heavy machinery while taking prescription pain medicine. °· Do not lift anything that is heavier than 10 lb (4.5 kg) until your health care provider approves. °· Do not play contact sports until your health care provider approves. °· Do not drive for 24 hours if you were given a medicine to help you relax  (sedative). °· Rest as needed. Do not return to work or school until your health care provider approves. °General instructions  °· Take over-the-counter and prescription medicines only as told by your health care provider. °· To prevent or treat constipation while you are taking prescription pain medicine, your health care provider may recommend that you: °¨ Drink enough fluid to keep your urine clear or pale yellow. °¨ Take over-the-counter or prescription medicines. °¨ Eat foods that are high in fiber, such as fresh fruits and vegetables, whole grains, and beans. °¨ Limit foods that are high in fat and processed sugars, such as fried and sweet foods. °Contact a health care provider if: °· You develop a rash. °· You have more redness, swelling, or pain around your incisions. °· You have more fluid or blood coming from your incisions. °· Your incisions feel warm to the touch. °· You have pus or a bad smell coming from your incisions. °· You have a fever. °· One or more of your incisions breaks open. °Get help right away if: °· You have trouble breathing. °· You have chest pain. °· You have increasing pain in your shoulders. °· You faint or feel dizzy when you stand. °· You have severe pain in your abdomen. °· You have nausea or vomiting that lasts for more than one day. °· You have leg pain. °This information is not intended to replace advice given to you by your health care provider. Make sure you discuss any   questions you have with your health care provider. °Document Released: 10/15/2005 Document Revised: 05/05/2016 Document Reviewed: 04/02/2016 °Elsevier Interactive Patient Education © 2017 [employer] ° °CCS ______[facility] °LAPAROSCOPIC SURGERY: POST OP INSTRUCTIONS °Always review your discharge instruction sheet given to you by the facility where your surgery was performed. °IF YOU HAVE DISABILITY OR FAMILY LEAVE FORMS, YOU MUST BRING THEM TO THE OFFICE FOR PROCESSING.   °DO NOT GIVE  THEM TO YOUR DOCTOR. ° °1. A prescription for pain medication may be given to you upon discharge.  Take your pain medication as prescribed, if needed.  If narcotic pain medicine is not needed, then you may take acetaminophen (Tylenol) or ibuprofen (Advil) as needed. °2. Take your usually prescribed medications unless otherwise directed. °3. If you need a refill on your pain medication, please contact your pharmacy.  They will contact our office to request authorization. Prescriptions will not be filled after 5pm or on week-ends. °4. You should follow a light diet the first few days after arrival home, such as soup and crackers, etc.  Be sure to include lots of fluids daily. °5. Most patients will experience some swelling and bruising in the area of the incisions.  Ice packs will help.  Swelling and bruising can take several days to resolve.  °6. It is common to experience some constipation if taking pain medication after surgery.  Increasing fluid intake and taking a stool softener (such as Colace) will usually help or prevent this problem from occurring.  A mild laxative (Milk of Magnesia or Miralax) should be taken according to package instructions if there are no bowel movements after 48 hours. °7. Unless discharge instructions indicate otherwise, you may remove your bandages 24-48 hours after surgery, and you may shower at that time.  You may have steri-strips (small skin tapes) in place directly over the incision.  These strips should be left on the skin for 7-10 days.  If your surgeon used skin glue on the incision, you may shower in 24 hours.  The glue will flake off over the next 2-3 weeks.  Any sutures or staples will be removed at the office during your follow-up visit. °8. ACTIVITIES:  You may resume regular (light) daily activities beginning the next day--such as daily self-care, walking, climbing stairs--gradually increasing activities as tolerated.  You may have sexual intercourse when it is  comfortable.  Refrain from any heavy lifting or straining until approved by your doctor. °a. You may drive when you are no longer taking prescription pain medication, you can comfortably wear a seatbelt, and you can safely maneuver your car and apply brakes. °b. RETURN TO WORK:  __________________________________________________________ °9. You should see your doctor in the office for a follow-up appointment approximately 2-3 weeks after your surgery.  Make sure that you call for this appointment within a day or two after you arrive home to insure a convenient appointment time. °10. OTHER INSTRUCTIONS: __________________________________________________________________________________________________________________________ __________________________________________________________________________________________________________________________ °WHEN TO CALL YOUR DOCTOR: °1. Fever over 101.0 °2. Inability to urinate °3. Continued bleeding from incision. °4. Increased pain, redness, or drainage from the incision. °5. Increasing abdominal pain ° °The clinic staff is available to answer your questions during regular business hours.  Please don’t hesitate to call and ask to speak to one of the nurses for clinical concerns.  If you have a medical emergency, go to the nearest emergency room or call 911.  A surgeon from [facility] is always on call at the hospital. °[address], [address], [place], [place]  [address] ? [address] [address], [place], [place]   [address] °[phone] ? [phone] ? FAX [phone] °Web site: [email or web address] ° °

## 2016-09-17 NOTE — Anesthesia Procedure Notes (Signed)
Procedure Name: Intubation Date/Time: 09/17/2016 4:08 PM Performed by: Faustino CongressWHITE, Eastyn Dattilo TENA Markees Carns Pre-anesthesia Checklist: Patient identified, Emergency Drugs available, Suction available and Patient being monitored Patient Re-evaluated:Patient Re-evaluated prior to inductionOxygen Delivery Method: Circle System Utilized Preoxygenation: Pre-oxygenation with 100% oxygen Intubation Type: IV induction, Rapid sequence and Cricoid Pressure applied Laryngoscope Size: Mac and 4 Grade View: Grade I Tube type: Oral Tube size: 7.0 mm Number of attempts: 1 Airway Equipment and Method: Stylet Placement Confirmation: ETT inserted through vocal cords under direct vision,  positive ETCO2 and breath sounds checked- equal and bilateral Secured at: 24 cm Tube secured with: Tape Dental Injury: Teeth and Oropharynx as per pre-operative assessment

## 2016-09-17 NOTE — ED Triage Notes (Addendum)
C/o stabbing pain to upper abd x 4 hours.  Denies nausea, vomiting, diarrhea, and constipation.  States he induced vomiting x 1 without relief. Denies ETOH and drug use today.

## 2016-09-17 NOTE — Progress Notes (Signed)
Willie Meyer is a 20 y.o. male patient admitted from ED awake, alert - oriented  X 4 - no acute distress noted.  VSS - Blood pressure 117/65, pulse (!) 59, temperature 97.6 F (36.4 C), temperature source Oral, resp. rate 16, SpO2 99 %.    IV in place, occlusive dsg intact without redness.  Orientation to room, and floor completed.  Admission INP armband ID verified with patient/family, and in place.   SR up x 2, fall assessment complete, with patient and family able to verbalize understanding of risk associated with falls, and verbalized understanding to call nsg before up out of bed.  Call light within reach, patient able to voice, and demonstrate understanding. No evidence of skin break down noted on exam.  Admission nurse notified of admission.     Will cont to eval and treat per MD orders.  Willie BogusKristie G Farris Geiman, RN 09/17/2016 8:04 AM

## 2016-09-17 NOTE — ED Provider Notes (Signed)
[facility]-EMER[facility]ENCY DEPT Provider Note   CSN: [id number] Arrival date & time: 09/17/16  [id number]  By signing my name below, I, Doreatha MartinEva Mathews, attest that this documentation has been prepared under the direction and in the presence of Shon Batonourtney F Horton, MD. Electronically Signed: Doreatha MartinEva Mathews, ED Scribe. 09/17/16. 1:44 AM.     History   Chief Complaint Chief Complaint  Patient presents with  . Abdominal Pain    HPI Willie Meyer is a 20 y.o. male otherwise healthy on no daily medications who presents to the Emergency Department complaining of moderate, sudden onset, sharp, 10/10 epigastric and mid abdominal pain onset 5 hours ago. He reports associated nausea. Pt states his pain is constant and non-radiating. Pt denies taking OTC medications at home to improve symptoms. He states his pain is worsened with sudden movements and alleviated while lying on his side. Pt states he was not eating when his pain began. Last BM yesterday was normal. No h/o of similar symptoms. Pt reports he occasionally smokes marijuana and consumes liquor. He reports his last alcoholic beverage was yesterday and he does not drink daily. He denies emesis, diarrhea, fever, CP, SOB.   The history is provided by the patient. No language interpreter was used.    Past Medical History:  Diagnosis Date  . Migraine     There are no active problems to display for this patient.   History reviewed. No pertinent surgical history.     Home Medications    Prior to Admission medications   Not on File    Family History Family History  Problem Relation Age of Onset  . Multiple sclerosis Mother   . Thyroid disease Mother     Social History Social History  Substance Use Topics  . Smoking status: Current Every Day Smoker  . Smokeless tobacco: Never Used  . Alcohol use Yes     Allergies   Patient has no known allergies.   Review of Systems Review of Systems  Constitutional: Negative for fever.    Respiratory: Negative for shortness of breath.   Cardiovascular: Negative for chest pain.  Gastrointestinal: Positive for abdominal pain and nausea. Negative for diarrhea and vomiting.  Genitourinary: Negative for dysuria.  All other systems reviewed and are negative.    Physical Exam Updated Vital Signs BP 135/94 (BP Location: Left Arm)   Pulse 70   Temp 97.6 F (36.4 C) (Oral)   Resp 18   SpO2 100%   Physical Exam  Constitutional: He is oriented to person, place, and time. He appears well-developed and well-nourished.  HENT:  Head: Normocephalic and atraumatic.  Cardiovascular: Normal rate, regular rhythm and normal heart sounds.   No murmur heard. Pulmonary/Chest: Effort normal and breath sounds normal. No respiratory distress. He has no wheezes.  Abdominal: Soft. Bowel sounds are normal. There is tenderness. There is no rebound.  Right upper quadrant and epigastric tenderness to palpation without rebound or guarding  Musculoskeletal: He exhibits no edema.  Neurological: He is alert and oriented to person, place, and time.  Skin: Skin is warm and dry.  Psychiatric: He has a normal mood and affect.  Nursing note and vitals reviewed.    ED Treatments / Results   DIAGNOSTIC STUDIES: Oxygen Saturation is 100% on RA, normal by my interpretation.    COORDINATION OF CARE: 1:42 AM Discussed treatment plan with pt at bedside which includes lab work and pt agreed to plan.    Labs (all labs ordered are listed, but only abnormal  results are displayed) Labs Reviewed  COMPREHENSIVE METABOLIC PANEL - Abnormal; Notable for the following:       Result Value   Glucose, Bld 100 (*)    AST 46 (*)    ALT 95 (*)    All other components within normal limits  LIPASE, BLOOD  CBC  URINALYSIS, ROUTINE W REFLEX MICROSCOPIC (NOT AT [facility])    EKG  EKG Interpretation None       Radiology Koreas Abdomen Limited Ruq  Result Date: 09/17/2016 CLINICAL DATA:  Right upper quadrant  pain. EXAM: US ABDOMEN LIMITED - RIGHT UPPER QUADRANT COMPARISON:  None. FINDINGS: Gallbladder: Stone in the gallbladder neck measuring 2.3 cm. Mild gallbladder wall thickening and edema at 3.6 mm. Murphy's sign is positive. Common bile duct: Diameter: 2.5 mm, normal Liver: No focal lesion identified. Within normal limits in parenchymal echogenicity. IMPRESSION: Cholelithiasis with gallbladder wall thickening and positive Murphy's sign. Changes are consistent with acute cholecystitis in the appropriate clinical setting. Electronically Signed   By: Burman NievesWilliam  Stevens M.D.   On: 09/17/2016 04:19    Procedures Procedures (including critical care time)  Medications Ordered in ED Medications  morphine 4 MG/ML injection 4 mg (4 mg Intravenous Given 09/17/16 0201)  ondansetron (ZOFRAN) injection 4 mg (4 mg Intravenous Given 09/17/16 0201)  sodium chloride 0.9 % bolus 1,000 mL (0 mLs Intravenous Stopped 09/17/16 0400)     Initial Impression / Assessment and Plan / ED Course  I have reviewed the triage vital signs and the nursing notes.  Pertinent labs & imaging results that were available during my care of the patient were reviewed by me and considered in my medical decision making (see chart for details).  Clinical Course     Patient presents with abdominal pain. Ongoing for the last 4 hours. Tender in epigastric and right upper quadrant. Otherwise vital signs are reassuring. Given pain and nausea medication. Minimal elevation in LFTs. Right upper quadrant ultrasound obtained. Shows gallbladder wall thickening, cholelithiasis, and positive Murphy sign. Concerning for cholecystitis.  Discussed with Dr. Magnus IvanBlackman. He will evaluate the patient.  Final Clinical Impressions(s) / ED Diagnoses   Final diagnoses:  RUQ pain  Cholecystitis    New Prescriptions New Prescriptions   No medications on file    I personally performed the services described in this documentation, which was scribed in my  presence. The recorded information has been reviewed and is accurate.    Shon Batonourtney F Horton, MD 09/17/16 [phone]

## 2016-09-17 NOTE — ED Notes (Signed)
Attempted report x 2 

## 2016-09-17 NOTE — Anesthesia Postprocedure Evaluation (Signed)
Anesthesia Post Note  Patient: Willie Meyer  Procedure(s) Performed: Procedure(s) (LRB): LAPAROSCOPIC CHOLECYSTECTOMY (N/A)  Patient location during evaluation: PACU Anesthesia Type: General Level of consciousness: awake and alert Pain management: pain level controlled Vital Signs Assessment: post-procedure vital signs reviewed and stable Respiratory status: spontaneous breathing, nonlabored ventilation, respiratory function stable and patient connected to nasal cannula oxygen Cardiovascular status: blood pressure returned to baseline and stable Postop Assessment: no signs of nausea or vomiting Anesthetic complications: no    Last Vitals:  Vitals:   09/17/16 1745 09/17/16 1759  BP:  (!) 152/70  Pulse: 68 68  Resp: 18   Temp:  36.9 C    Last Pain:  Vitals:   09/17/16 1807  TempSrc:   PainSc: 10-Worst pain ever                 Brently Voorhis,W. EDMOND

## 2016-09-17 NOTE — ED Notes (Signed)
Dr. Horton at bedside. 

## 2016-09-17 NOTE — Op Note (Signed)
09/17/2016  4:58 PM  PATIENT:  Willie Meyer  20 y.o. male  PRE-OPERATIVE DIAGNOSIS:  cholelithiasis   POST-OPERATIVE DIAGNOSIS:  Cholelithiasis, chronic cholecystitis   PROCEDURE:  Procedure(s): LAPAROSCOPIC CHOLECYSTECTOMY (N/A)  SURGEON:  Surgeon(s) and Role:    * Axel FillerArmando Afsana Liera, MD - Primary  ANESTHESIA:   local and general  EBL:5cc Total I/O In: 2000 [I.V.:2000] Out: 300 [Urine:300]  BLOOD ADMINISTERED:none  DRAINS: none   LOCAL MEDICATIONS USED:  BUPIVICAINE   SPECIMEN:  Source of Specimen:  gallbladder  DISPOSITION OF SPECIMEN:  PATHOLOGY  COUNTS:  YES  TOURNIQUET:  * No tourniquets in log *  DICTATION: .Dragon Dictation The patient was taken to the operating and placed in the supine position with bilateral SCDs in place. The patient was prepped and draped in the usual sterile fashion. A time out was called and all facts were verified. A pneumoperitoneum was obtained via A Veress needle technique to a pressure of 14mm of mercury.  A 5mm trochar was then placed in the right upper quadrant under visualization, and there were no injuries to any abdominal organs. A 11 mm port was then placed in the umbilical region after infiltrating with local anesthesia under direct visualization. A second and third epigastric port and right lower quadrant port placement under direct visualization, respectively.  The gallbladder was identified and retracted, the peritoneum was then sharply dissected from the gallbladder and this dissection was carried down to Calot's triangle. The cystic duct was identified and stripped away circumferentially and seen going into the gallbladder 360, the critical angle was obtained.  2 clips were placed proximally one distally and the cystic duct transected. The cystic artery was identified and 2 clips placed proximally and one distally and transected. We then proceeded to remove the gallbladder off the hepatic fossa with Bovie cautery. A  retrieval bag was then placed in the abdomen and gallbladder placed in the bag. The hepatic fossa was then reexamined and hemostasis was achieved with Bovie cautery and was excellent at the end of the case. The subhepatic fossa and perihepatic fossa was then irrigated until the effluent was clear.  The gallbladder and bag were removed from the abdominal cavity. The 11 mm trocar fascia was reapproximated with the Endo Close #1 Vicryl x2. The pneumoperitoneum was evacuated and all trochars removed under direct visulalization. The skin was then closed with 4-0 Monocryl and the skin dressed with Steri-Strips, gauze, and tape. The patient was awaken from general anesthesia and taken to the recovery room in stable condition.   PLAN OF CARE: Admit for overnight observation  PATIENT DISPOSITION:  PACU - hemodynamically stable.   Delay start of Pharmacological VTE agent (>24hrs) due to surgical blood loss or risk of bleeding: not applicable

## 2016-09-17 NOTE — Progress Notes (Signed)
Report called to Short stay  

## 2016-09-17 NOTE — Anesthesia Preprocedure Evaluation (Addendum)
Anesthesia Evaluation  Patient identified by MRN, date of birth, ID band Patient awake    Reviewed: Allergy & Precautions, H&P , NPO status , Patient's Chart, lab work & pertinent test results  Airway Mallampati: II  TM Distance: >3 FB Neck ROM: Full    Dental no notable dental hx. (+) Teeth Intact, Dental Advisory Given   Pulmonary neg pulmonary ROS,    Pulmonary exam normal breath sounds clear to auscultation       Cardiovascular negative cardio ROS   Rhythm:Regular Rate:Normal     Neuro/Psych  Headaches, negative psych ROS   GI/Hepatic negative GI ROS, Neg liver ROS,   Endo/Other  negative endocrine ROS  Renal/GU negative Renal ROS  negative genitourinary   Musculoskeletal   Abdominal   Peds  Hematology negative hematology ROS (+)   Anesthesia Other Findings   Reproductive/Obstetrics negative OB ROS                            Anesthesia Physical Anesthesia Plan  ASA: II  Anesthesia Plan: General   Post-op Pain Management:    Induction: Intravenous  Airway Management Planned: Oral ETT  Additional Equipment:   Intra-op Plan:   Post-operative Plan: Extubation in OR  Informed Consent: I have reviewed the patients History and Physical, chart, labs and discussed the procedure including the risks, benefits and alternatives for the proposed anesthesia with the patient or authorized representative who has indicated his/her understanding and acceptance.   Dental advisory given  Plan Discussed with: CRNA  Anesthesia Plan Comments:         Anesthesia Quick Evaluation

## 2016-09-18 ENCOUNTER — Encounter (HOSPITAL_COMMUNITY): Payer: Self-pay | Admitting: General Surgery

## 2016-09-18 LAB — COMPREHENSIVE METABOLIC PANEL
ALBUMIN: 3.5 g/dL (ref 3.5–5.0)
ALT: 125 U/L — ABNORMAL HIGH (ref 17–63)
AST: 71 U/L — AB (ref 15–41)
Alkaline Phosphatase: 81 U/L (ref 38–126)
Anion gap: 12 (ref 5–15)
BUN: 5 mg/dL — AB (ref 6–20)
CHLORIDE: 95 mmol/L — AB (ref 101–111)
CO2: 29 mmol/L (ref 22–32)
Calcium: 9 mg/dL (ref 8.9–10.3)
Creatinine, Ser: 1.04 mg/dL (ref 0.61–1.24)
GFR calc Af Amer: >60 mL/min (ref >60)
GFR calc non Af Amer: >60 mL/min (ref >60)
GLUCOSE: 140 mg/dL — AB (ref 65–99)
POTASSIUM: 3.8 mmol/L (ref 3.5–5.1)
SODIUM: 136 mmol/L (ref 135–145)
Total Bilirubin: 0.8 mg/dL (ref 0.3–1.2)
Total Protein: 7.1 g/dL (ref 6.5–8.1)

## 2016-09-18 MED ORDER — METHOCARBAMOL 500 MG PO TABS
1000.0000 mg | ORAL_TABLET | Freq: Three times a day (TID) | ORAL | Status: DC | PRN
  Administered 2016-09-18 – 2016-09-19 (×4): 1000 mg via ORAL
  Filled 2016-09-18 (×4): qty 2

## 2016-09-18 MED ORDER — IBUPROFEN 600 MG PO TABS
600.0000 mg | ORAL_TABLET | Freq: Four times a day (QID) | ORAL | Status: DC | PRN
  Administered 2016-09-18 – 2016-09-19 (×2): 600 mg via ORAL
  Filled 2016-09-18 (×2): qty 1

## 2016-09-18 MED ORDER — ACETAMINOPHEN 325 MG PO TABS: 650.0000 mg | ORAL_TABLET | Freq: Four times a day (QID) | ORAL | Status: DC | PRN

## 2016-09-18 MED ORDER — SODIUM CHLORIDE 0.9 % IV SOLN
INTRAVENOUS | Status: DC
  Administered 2016-09-18 – 2016-09-19 (×3): via INTRAVENOUS

## 2016-09-18 MED ORDER — HYDROMORPHONE HCL 2 MG/ML IJ SOLN
1.0000 mg | INTRAMUSCULAR | Status: DC | PRN
  Administered 2016-09-18: 2 mg via INTRAVENOUS
  Filled 2016-09-18: qty 1

## 2016-09-18 MED ORDER — KETOROLAC TROMETHAMINE 30 MG/ML IJ SOLN
30.0000 mg | Freq: Once | INTRAMUSCULAR | Status: AC
  Administered 2016-09-18: 30 mg via INTRAVENOUS
  Filled 2016-09-18: qty 1

## 2016-09-18 NOTE — Progress Notes (Signed)
1 Day Post-Op  Subjective: Complaining of severe pain and not able to eat this AM. IV is off.  Sites look fine. Taking a fair amount of dilaudid,  4 mg today, 20 mg oxycodone so far today  Objective: Vital signs in last 24 hours: Temp:  [97.7 F (36.5 C)-99 F (37.2 C)] 97.7 F (36.5 C) (11/21 0925) Pulse Rate:  [55-78] 74 (11/21 0925) Resp:  [18-22] 18 (11/21 0925) BP: (132-159)/(53-74) 137/54 (11/21 0925) SpO2:  [95 %-100 %] 100 % (11/21 0925) Weight:  [79.4 kg (175 lb)] 79.4 kg (175 lb) (11/20 1131)  3000 IV 1450 urine Afebrile, VSS No labs Intake/Output from previous day: 11/20 0701 - 11/21 0700 In: 2940 [P.O.:240; I.V.:2700] Out: 1450 [Urine:1450] Intake/Output this shift: Total I/O In: 3044.1 [I.V.:2994.1; IV Piggyback:50] Out: -   General appearance: alert, cooperative, mild distress and complaining of significant pain. GI: soft, sore sites OK  Lab Results:   Recent Labs  09/17/16 0046  WBC 6.7  HGB 15.4  HCT 45.2  PLT 231    BMET  Recent Labs  09/17/16 0046  NA 140  K 3.6  CL 105  CO2 28  GLUCOSE 100*  BUN 8  CREATININE 1.10  CALCIUM 8.9   PT/INR No results for input(s): LABPROT, INR in the last 72 hours.   Recent Labs Lab 09/17/16 0046  AST 46*  ALT 95*  ALKPHOS 88  BILITOT 0.5  PROT 6.8  ALBUMIN 3.8     Lipase     Component Value Date/Time   LIPASE 23 09/17/2016 0046     Studies/Results: Koreas Abdomen Limited Ruq  Result Date: 09/17/2016 CLINICAL DATA:  Right upper quadrant pain. EXAM: US ABDOMEN LIMITED - RIGHT UPPER QUADRANT COMPARISON:  None. FINDINGS: Gallbladder: Stone in the gallbladder neck measuring 2.3 cm. Mild gallbladder wall thickening and edema at 3.6 mm. Murphy's sign is positive. Common bile duct: Diameter: 2.5 mm, normal Liver: No focal lesion identified. Within normal limits in parenchymal echogenicity. IMPRESSION: Cholelithiasis with gallbladder wall thickening and positive Murphy's sign. Changes are consistent  with acute cholecystitis in the appropriate clinical setting. Electronically Signed   By: Burman NievesWilliam  Stevens M.D.   On: 09/17/2016 04:19   Prior to Admission medications   Not on File    Medications: . cefTRIAXone (ROCEPHIN)  IV  2 g Intravenous Q24H  . enoxaparin (LOVENOX) injection  40 mg Subcutaneous Q24H     Assessment/Plan Cholelithiasis, chronic cholecystitis  S/p laparoscopic cholecystectomy, 09/17/16, Dr. Derrell Lollingamirez FEN:  Soft diet ID:  Rocephin day 2 DVT:  Lovenox  Plan:  Toradol and restart fluids for now.  I am going to decrease IV dilaudid.  Will recheck later.    LOS: 0 days    Nain Rudd 09/18/2016 [phone]

## 2016-09-19 LAB — CBC
HEMATOCRIT: 44.7 % (ref 39.0–52.0)
HEMOGLOBIN: 15.4 g/dL (ref 13.0–17.0)
MCH: 30 pg (ref 26.0–34.0)
MCHC: 34.5 g/dL (ref 30.0–36.0)
MCV: 87 fL (ref 78.0–100.0)
Platelets: 252 10*3/uL (ref 150–400)
RBC: 5.14 MIL/uL (ref 4.22–5.81)
RDW: 13.1 % (ref 11.5–15.5)
WBC: 7.1 10*3/uL (ref 4.0–10.5)

## 2016-09-19 MED ORDER — HYDROMORPHONE HCL 2 MG/ML IJ SOLN: 1.0000 mg | INTRAMUSCULAR | Status: DC | PRN

## 2016-09-19 MED ORDER — METHOCARBAMOL 500 MG PO TABS: 1000.0000 mg | ORAL_TABLET | Freq: Three times a day (TID) | ORAL | 0 refills | Status: DC | PRN

## 2016-09-19 MED ORDER — OXYCODONE HCL 5 MG PO TABS: 5.0000 mg | ORAL_TABLET | Freq: Four times a day (QID) | ORAL | 0 refills | Status: DC | PRN

## 2016-09-19 MED ORDER — ONDANSETRON 4 MG PO TBDP: 4.0000 mg | ORAL_TABLET | Freq: Four times a day (QID) | ORAL | 0 refills | Status: DC | PRN

## 2016-09-19 MED ORDER — IBUPROFEN 600 MG PO TABS: 600.0000 mg | ORAL_TABLET | Freq: Four times a day (QID) | ORAL | 0 refills | Status: DC | PRN

## 2016-09-19 NOTE — Progress Notes (Signed)
2 Days Post-Op  Subjective: Pain somewhat improved. Able to eat dinner without issue and inquiring about breakfast. Has not been walking.   Objective: Vital signs in last 24 hours: Temp:  [97.7 F (36.5 C)-98.5 F (36.9 C)] 98.5 F (36.9 C) (11/22 0508) Pulse Rate:  [57-78] 72 (11/22 0508) Resp:  [17-18] 18 (11/22 0508) BP: (125-146)/(54-64) 125/54 (11/22 0508) SpO2:  [97 %-100 %] 97 % (11/22 0508) Last BM Date:  (Not yet)3000 IV 1450 urine Afebrile, VSS No labs Intake/Output from previous day: 11/21 0701 - 11/22 0700 In: 5340.8 [P.O.:360; I.V.:4880.8; IV Piggyback:100] Out: 950 [Urine:950] Intake/Output this shift: Total I/O In: 1521.7 [P.O.:240; I.V.:1281.7] Out: 650 [Urine:650]  Sleeping, easily awakened Abdomen soft, nondistended, tender. Incisions clean and intact without signs of infection, some oozing of umbilical site  Lab Results:   Recent Labs  09/17/16 0046 09/19/16 0524  WBC 6.7 7.1  HGB 15.4 15.4  HCT 45.2 44.7  PLT 231 252    BMET  Recent Labs  09/17/16 0046 09/18/16 1134  NA 140 136  K 3.6 3.8  CL 105 95*  CO2 28 29  GLUCOSE 100* 140*  BUN 8 5*  CREATININE 1.10 1.04  CALCIUM 8.9 9.0   PT/INR No results for input(s): LABPROT, INR in the last 72 hours.   Recent Labs Lab 09/17/16 0046 09/18/16 1134  AST 46* 71*  ALT 95* 125*  ALKPHOS 88 81  BILITOT 0.5 0.8  PROT 6.8 7.1  ALBUMIN 3.8 3.5     Lipase     Component Value Date/Time   LIPASE 23 09/17/2016 0046     Studies/Results: No results found. Prior to Admission medications   Not on File    Medications: . enoxaparin (LOVENOX) injection  40 mg Subcutaneous Q24H   . sodium chloride 100 mL/hr at 09/19/16 0645   Assessment/Plan Cholelithiasis, chronic cholecystitis  S/p laparoscopic cholecystectomy, 09/17/16, Dr. Derrell Lollingamirez FEN:  Soft diet ID:  Rocephin day 2 DVT:  Lovenox  Plan:  Can DC later today if walking and pain continues to improve.    LOS: 1 day     Berna BueChelsea A Antoin Dargis 09/19/2016

## 2016-09-19 NOTE — Progress Notes (Signed)
D/C papers gone over with pt. IV taken out. Prescriptions given to pt. No questions/complaints. Pt.'s brother did not arrive until after 6:00 PM to pick the pt. Up. I told the pt. He would not make it to the outpatient pharmacy tonight and he stated he would be okay with going in the morning to get his prescriptions filled. Pt. D/c'd successfully via w/c.

## 2016-09-19 NOTE — Care Management Note (Signed)
Case Management Note  Patient Details  Name: Willie Meyer MRN: [id number] Date of Birth: 09/19/1996  Subjective/Objective:     Pt presented for Abdominal pain-Cholelithiasis, chronic cholecystitis. S/p LAPAROSCOPIC CHOLECYSTECTOMY. Pt is without insurance. Has support of mother that will assist with pain medications upon d/c.               Action/Plan: CM did speak with pt in regards to medications. CM did call Several pharmacies for cheaper prices on Robaxin and Zofran. [facility] and pt is aware. CM to place information on AVS. No further needs from CM at this time.   Expected Discharge Date:                  Expected Discharge Plan:  Home/Self Care  In-House Referral:  NA  Discharge planning Services  CM Consult, Medication Assistance  Post Acute Care Choice:  NA Choice offered to:  NA  DME Arranged:  N/A DME Agency:  NA  HH Arranged:  NA HH Agency:  NA  Status of Service:  Completed, signed off  If discussed at Long Length of Stay Meetings, dates discussed:    Additional Comments:  Gala LewandowskyGraves-Bigelow, Rubby Barbary Kaye, RN 09/19/2016, 2:35 PM

## 2016-09-19 NOTE — Progress Notes (Signed)
Patient ID: Willie Meyer, male   DOB: 08/07/1996, 20 y.o.   MRN: [id number]  [facility]  2 Days Post-Op  Subjective: Tired but overall feeling better. Tolerating small amounts of diet. Ambulated with no issues.  Objective: Vital signs in last 24 hours: Temp:  [97.9 F (36.6 C)-98.5 F (36.9 C)] 98.5 F (36.9 C) (11/22 0508) Pulse Rate:  [57-78] 72 (11/22 0508) Resp:  [17-18] 18 (11/22 0508) BP: (125-146)/(54-64) 125/54 (11/22 0508) SpO2:  [97 %-100 %] 97 % (11/22 0508) Last BM Date:  (Not yet)  Intake/Output from previous day: 11/21 0701 - 11/22 0700 In: 5340.8 [P.O.:360; I.V.:4880.8; IV Piggyback:100] Out: 950 [Urine:950] Intake/Output this shift: No intake/output data recorded.  PE: Gen:  Alert, NAD, pleasant Pulm: effort normal Abd: Soft, ND, appropriately tender, incisions C/D/I  Lab Results:   Recent Labs  09/17/16 0046 09/19/16 0524  WBC 6.7 7.1  HGB 15.4 15.4  HCT 45.2 44.7  PLT 231 252   BMET  Recent Labs  09/17/16 0046 09/18/16 1134  NA 140 136  K 3.6 3.8  CL 105 95*  CO2 28 29  GLUCOSE 100* 140*  BUN 8 5*  CREATININE 1.10 1.04  CALCIUM 8.9 9.0   PT/INR No results for input(s): LABPROT, INR in the last 72 hours. CMP     Component Value Date/Time   NA 136 09/18/2016 1134   K 3.8 09/18/2016 1134   CL 95 (L) 09/18/2016 1134   CO2 29 09/18/2016 1134   GLUCOSE 140 (H) 09/18/2016 1134   BUN 5 (L) 09/18/2016 1134   CREATININE 1.04 09/18/2016 1134   CALCIUM 9.0 09/18/2016 1134   PROT 7.1 09/18/2016 1134   ALBUMIN 3.5 09/18/2016 1134   AST 71 (H) 09/18/2016 1134   ALT 125 (H) 09/18/2016 1134   ALKPHOS 81 09/18/2016 1134   BILITOT 0.8 09/18/2016 1134   GFRNONAA >60 09/18/2016 1134   GFRAA >60 09/18/2016 1134   Lipase     Component Value Date/Time   LIPASE 23 09/17/2016 0046       Studies/Results: No results found.  Anti-infectives: Anti-infectives    Start     Dose/Rate Route Frequency  Ordered Stop   09/17/16 0800  cefTRIAXone (ROCEPHIN) 2 g in dextrose 5 % 50 mL IVPB  Status:  Discontinued     2 g 100 mL/hr over 30 Minutes Intravenous Every 24 hours 09/17/16 0759 09/18/16 1115       Assessment/Plan Cholelithiasis, chronic cholecystitis S/p laparoscopic cholecystectomy, 09/17/16, Dr. Derrell Lollingamirez  FEN:  regular DVT:  Lovenox  Plan:  patient doing better this afternoon. Pain is better controlled, no current nausea, and he has ambulated. Ready for discharge. He will follow up in our clinic in 2 weeks.   LOS: 1 day    Edson SnowballBROOKE A MILLER , [facility] 09/19/2016, 2:24 PM Pager: [phone] Consults: [phone] Mon-Fri 7:00 am-4:30 pm Sat-Sun 7:00 am-11:30 am

## 2016-09-21 MED FILL — oxyCODONE HCL 5 MG TABS: 5 | 3 days supply | Qty: 30 | Fill #0

## 2016-09-21 MED FILL — METHOCARBAMOL 500 MG TABLET: 500 | 3 days supply | Qty: 20 | Fill #0

## 2016-09-30 NOTE — Discharge Summary (Signed)
  [facility] Discharge Summary   Patient ID: Willie Meyer MRN: [id number] DOB/AGE: 22/05/1996 22 y.o.  Admit date: 09/17/2016 Discharge date: 09/19/2016  Admitting Diagnosis: Cholecystitis RUQ pain  Discharge Diagnosis Patient Active Problem List   Diagnosis Date Noted  . Cholecystitis with cholelithiasis 09/17/2016    Consultants None  Imaging: US abdomen limited RUQ 09/17/16: Cholelithiasis with gallbladder wall thickening and positive Murphy's sign. Changes are consistent with acute cholecystitis in the appropriate clinical setting.  Procedures Dr. Derrell Lollingamirez (09/17/16) - [facility]  Hospital Course:  Willie Meyer is a 20yo male who [facility] with 1 days of epigastric pain that later localized to the RUQ.  Workup showed acute cholecystitis with cholelithiasis.  Patient was admitted and underwent procedure listed above.  Tolerated procedure well and was transferred to the floor.  He was slow to progress with his pain improvement and PO intake, but on POD2 the patient was voiding well, tolerating diet, ambulating well, pain well controlled, vital signs stable, incisions c/d/i and felt stable for discharge home.  Patient will follow up in our office in 3 weeks and knows to call with questions or concerns.    Physical Exam: Gen:  Alert, NAD, pleasant Pulm: effort normal Abd: Soft, ND, appropriately tender, incisions C/D/I    Medication List    TAKE these medications   ibuprofen 600 MG tablet Commonly known as:  ADVIL,MOTRIN Take 1 tablet (600 mg total) by mouth every 6 (six) hours as needed for mild pain or moderate pain. You can get this over the counter.   methocarbamol 500 MG tablet Commonly known as:  ROBAXIN Take 2 tablets (1,000 mg total) by mouth every 8 (eight) hours as needed for muscle spasms.   ondansetron 4 MG disintegrating tablet Commonly known as:  ZOFRAN-ODT Take 1 tablet (4 mg total)  by mouth every 6 (six) hours as needed for nausea.   oxyCODONE 5 MG immediate release tablet Commonly known as:  Oxy IR/ROXICODONE Take 1-2 tablets (5-10 mg total) by mouth every 6 (six) hours as needed for moderate pain.        Follow-up Information    [facility] 10/02/2016.   Specialty:  General Surgery Why:  your appointment is at 2:30 PM, be at the office 30 minutes early for check in.  Bring photo ID and insurance information. Contact information: [address] [place] [id number] [phone]       [facility]. Go to.   Why:  Please take Rx's to this location to pick up medications.   Contact information: [address], [place], [place] [address] Hours: Open today  7:30AM-6PM Phone: [phone]                Signed: Edson SnowballBROOKE A MILLER, [facility] 09/30/2016, 11:11 AM Pager: [phone] Consults: [phone] Mon-Fri 7:00 am-4:30 pm Sat-Sun 7:00 am-11:30 am

## 2017-10-01 ENCOUNTER — Emergency Department (HOSPITAL_COMMUNITY): Payer: No Typology Code available for payment source

## 2017-10-01 ENCOUNTER — Emergency Department (HOSPITAL_COMMUNITY)
Admission: EM | Admit: 2017-10-01 | Discharge: 2017-10-01 | Disposition: A | Discharge status: Home / Self Care | Payer: No Typology Code available for payment source | Attending: Emergency Medicine | Admitting: Emergency Medicine

## 2017-10-01 ENCOUNTER — Encounter (HOSPITAL_COMMUNITY): Payer: Self-pay

## 2017-10-01 ENCOUNTER — Other Ambulatory Visit: Payer: Self-pay

## 2017-10-01 DIAGNOSIS — Y999 Unspecified external cause status: Secondary | ICD-10-CM | POA: Diagnosis not present

## 2017-10-01 DIAGNOSIS — Y9389 Activity, other specified: Secondary | ICD-10-CM | POA: Insufficient documentation

## 2017-10-01 DIAGNOSIS — M546 Pain in thoracic spine: Secondary | ICD-10-CM | POA: Insufficient documentation

## 2017-10-01 DIAGNOSIS — R200 Anesthesia of skin: Secondary | ICD-10-CM | POA: Insufficient documentation

## 2017-10-01 DIAGNOSIS — R079 Chest pain, unspecified: Secondary | ICD-10-CM

## 2017-10-01 DIAGNOSIS — Y92410 Unspecified street and highway as the place of occurrence of the external cause: Secondary | ICD-10-CM | POA: Insufficient documentation

## 2017-10-01 DIAGNOSIS — R109 Unspecified abdominal pain: Secondary | ICD-10-CM | POA: Diagnosis not present

## 2017-10-01 DIAGNOSIS — R202 Paresthesia of skin: Secondary | ICD-10-CM | POA: Diagnosis not present

## 2017-10-01 DIAGNOSIS — R51 Headache: Secondary | ICD-10-CM | POA: Insufficient documentation

## 2017-10-01 LAB — COMPREHENSIVE METABOLIC PANEL
ALBUMIN: 3.9 g/dL (ref 3.5–5.0)
ALK PHOS: 107 U/L (ref 38–126)
ALT: 43 U/L (ref 17–63)
AST: 35 U/L (ref 15–41)
Anion gap: 9 (ref 5–15)
BILIRUBIN TOTAL: 0.5 mg/dL (ref 0.3–1.2)
BUN: 7 mg/dL (ref 6–20)
CALCIUM: 9.3 mg/dL (ref 8.9–10.3)
CO2: 24 mmol/L (ref 22–32)
CREATININE: 1.08 mg/dL (ref 0.61–1.24)
Chloride: 104 mmol/L (ref 101–111)
GFR calc Af Amer: >60 mL/min (ref >60)
GFR calc non Af Amer: >60 mL/min (ref >60)
GLUCOSE: 106 mg/dL — AB (ref 65–99)
Potassium: 4 mmol/L (ref 3.5–5.1)
SODIUM: 137 mmol/L (ref 135–145)
Total Protein: 7 g/dL (ref 6.5–8.1)

## 2017-10-01 LAB — CBC
HEMATOCRIT: 45.8 % (ref 39.0–52.0)
HEMOGLOBIN: 16 g/dL (ref 13.0–17.0)
MCH: 30.1 pg (ref 26.0–34.0)
MCHC: 34.9 g/dL (ref 30.0–36.0)
MCV: 86.3 fL (ref 78.0–100.0)
Platelets: 270 10*3/uL (ref 150–400)
RBC: 5.31 MIL/uL (ref 4.22–5.81)
RDW: 13 % (ref 11.5–15.5)
WBC: 6.8 10*3/uL (ref 4.0–10.5)

## 2017-10-01 MED ORDER — DIAZEPAM 5 MG PO TABS
5.0000 mg | ORAL_TABLET | Freq: Once | ORAL | Status: AC
  Administered 2017-10-01: 5 mg via ORAL
  Filled 2017-10-01: qty 1

## 2017-10-01 MED ORDER — MORPHINE SULFATE (PF) 4 MG/ML IV SOLN
4.0000 mg | Freq: Once | INTRAVENOUS | Status: AC
  Administered 2017-10-01: 4 mg via INTRAVENOUS
  Filled 2017-10-01: qty 1

## 2017-10-01 MED ORDER — CYCLOBENZAPRINE HCL 10 MG PO TABS: 10.0000 mg | ORAL_TABLET | Freq: Three times a day (TID) | ORAL | 0 refills | Status: DC | PRN

## 2017-10-01 MED ORDER — IOPAMIDOL (ISOVUE-300) INJECTION 61%
INTRAVENOUS | Status: AC
  Administered 2017-10-01: 100 mL
  Filled 2017-10-01: qty 100

## 2017-10-01 MED ORDER — OXYCODONE-ACETAMINOPHEN 5-325 MG PO TABS
2.0000 | ORAL_TABLET | Freq: Once | ORAL | Status: AC
  Administered 2017-10-01: 2 via ORAL
  Filled 2017-10-01: qty 2

## 2017-10-01 MED ORDER — IBUPROFEN 600 MG PO TABS: 600.0000 mg | ORAL_TABLET | Freq: Three times a day (TID) | ORAL | 0 refills | Status: DC | PRN
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
## 2017-10-01 NOTE — ED Notes (Signed)
Attempted to sit patient up. Pt reports that he is in too much pain. Reassure patient and attempted to help to the bathroom and he reported he would not like to move.

## 2017-10-01 NOTE — ED Notes (Signed)
MD Campos at the bedside  
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
## 2020-11-25 ENCOUNTER — Emergency Department (HOSPITAL_COMMUNITY): Payer: Self-pay

## 2020-11-25 ENCOUNTER — Encounter (HOSPITAL_COMMUNITY): Payer: Self-pay | Admitting: Emergency Medicine

## 2020-11-25 ENCOUNTER — Emergency Department (HOSPITAL_COMMUNITY)
Admission: EM | Admit: 2020-11-25 | Discharge: 2020-11-25 | Disposition: A | Discharge status: Home / Self Care | Payer: Self-pay | Attending: Emergency Medicine | Admitting: Emergency Medicine

## 2020-11-25 ENCOUNTER — Other Ambulatory Visit: Payer: Self-pay

## 2020-11-25 DIAGNOSIS — R0789 Other chest pain: Secondary | ICD-10-CM | POA: Insufficient documentation

## 2020-11-25 DIAGNOSIS — R079 Chest pain, unspecified: Secondary | ICD-10-CM

## 2020-11-25 LAB — CBC
HCT: 45 % (ref 39.0–52.0)
Hemoglobin: 15.4 g/dL (ref 13.0–17.0)
MCH: 30 pg (ref 26.0–34.0)
MCHC: 34.2 g/dL (ref 30.0–36.0)
MCV: 87.5 fL (ref 80.0–100.0)
Platelets: 314 10*3/uL (ref 150–400)
RBC: 5.14 MIL/uL (ref 4.22–5.81)
RDW: 13.2 % (ref 11.5–15.5)
WBC: 5.8 10*3/uL (ref 4.0–10.5)
nRBC: 0 % (ref 0.0–0.2)

## 2020-11-25 LAB — BASIC METABOLIC PANEL
Anion gap: 9 (ref 5–15)
BUN: 5 mg/dL — ABNORMAL LOW (ref 6–20)
CO2: 26 mmol/L (ref 22–32)
Calcium: 9.4 mg/dL (ref 8.9–10.3)
Chloride: 104 mmol/L (ref 98–111)
Creatinine, Ser: 1.02 mg/dL (ref 0.61–1.24)
GFR, Estimated: >60 mL/min (ref >60)
Glucose, Bld: 97 mg/dL (ref 70–99)
Potassium: 3.7 mmol/L (ref 3.5–5.1)
Sodium: 139 mmol/L (ref 135–145)

## 2020-11-25 LAB — TROPONIN I (HIGH SENSITIVITY): Troponin I (High Sensitivity): 3 ng/L (ref <18)

## 2020-11-25 NOTE — ED Provider Notes (Signed)
[facility] Provider Note   CSN: [id number] Arrival date & time: 11/25/20  1117     History Chief Complaint  Patient presents with  . Chest Pain    [facility] is a 25 y.o. male who presents to the ED today with complaint of sudden onset, intermittent, sharp, substernal chest pain x 2 months. Most recent episode this morning around 5 AM - pt reports he was woken out of his sleep s/2 pain. He states when the pain comes in his whole body will "lock up" and he feels like he can't move. This will last for approximately 15-17 minutes before dissipating on its own. Pt reports he feels like he can't get a good breath because his body feels "tight" but otherwise denies SOB. He states the pain will come on just about every other day. He reports + Fhx of CAD and wanted to make sure he wasn't having a heart attack.  Pt has no active chest pain currently. No hx DVT/PE. No recent prolonged travel or immobilization. No hemoptysis. No active malignancy. No exogenous hormone use.   The history is provided by the patient and medical records.    HPI: A 25 year old patient presents for evaluation of chest pain. Initial onset of pain was more than 6 hours ago. The patient's chest pain is well-localized, is described as heaviness/pressure/tightness and is not worse with exertion. The patient's chest pain is middle- or left-sided, is not sharp and does not radiate to the arms/jaw/neck. The patient does not complain of nausea and denies diaphoresis. The patient has a family history of coronary artery disease in a first-degree relative with onset less than age 57. The patient has no history of stroke, has no history of peripheral artery disease, has not smoked in the past 90 days, denies any history of treated diabetes, is not hypertensive, has no history of hypercholesterolemia and does not have an elevated BMI (>=30).   Past Medical History:  Diagnosis Date  .  Cholelithiasis 08/2016  . Migraine     Patient Active Problem List   Diagnosis Date Noted  . Cholecystitis with cholelithiasis 09/17/2016    Past Surgical History:  Procedure Laterality Date  . CHOLECYSTECTOMY N/A 09/17/2016   Procedure: LAPAROSCOPIC CHOLECYSTECTOMY;  Surgeon: Axel Filler, MD;  Location: [facility] OR;  Service: General;  Laterality: N/A;  . NO PAST SURGERIES         Family History  Problem Relation Age of Onset  . Multiple sclerosis Mother   . Thyroid disease Mother     Social History   Tobacco Use  . Smoking status: Never Smoker  . Smokeless tobacco: Never Used  Substance Use Topics  . Alcohol use: Yes  . Drug use: Yes    Types: Marijuana    Home Medications Prior to Admission medications   Medication Sig Start Date End Date Taking? Authorizing Provider  cyclobenzaprine (FLEXERIL) 10 MG tablet Take 1 tablet (10 mg total) by mouth 3 (three) times daily as needed for muscle spasms. 10/01/17   Azalia Bilis, MD  ibuprofen (ADVIL,MOTRIN) 600 MG tablet Take 1 tablet (600 mg total) by mouth every 8 (eight) hours as needed. 10/01/17   Azalia Bilis, MD  methocarbamol (ROBAXIN) 500 MG tablet Take 2 tablets (1,000 mg total) by mouth every 8 (eight) hours as needed for muscle spasms. 09/19/16   Meuth, Brooke A, PA-C  ondansetron (ZOFRAN-ODT) 4 MG disintegrating tablet Take 1 tablet (4 mg total) by mouth every 6 (six)  hours as needed for nausea. 09/19/16   Meuth, Brooke A, PA-C  oxyCODONE (OXY IR/ROXICODONE) 5 MG immediate release tablet Take 1-2 tablets (5-10 mg total) by mouth every 6 (six) hours as needed for moderate pain. 09/19/16   Meuth, Lina Sar, PA-C    Allergies    Patient has no known allergies.  Review of Systems   Review of Systems  Constitutional: Negative for chills and fever.  Respiratory: Negative for cough and shortness of breath.   Cardiovascular: Positive for chest pain. Negative for palpitations and leg swelling.  All other systems reviewed  and are negative.   Physical Exam Updated Vital Signs BP (!) 149/61   Pulse 60   Temp 98.8 F (37.1 C) (Oral)   Resp 18   Ht 5\' 9"  (1.753 m)   Wt 81.6 kg   SpO2 99%   BMI 26.58 kg/m   Physical Exam Vitals and nursing note reviewed.  Constitutional:      Appearance: He is not ill-appearing or diaphoretic.  HENT:     Head: Normocephalic and atraumatic.  Eyes:     Conjunctiva/sclera: Conjunctivae normal.  Cardiovascular:     Rate and Rhythm: Normal rate and regular rhythm.     Pulses:          Radial pulses are 2+ on the right side and 2+ on the left side.       Dorsalis pedis pulses are 2+ on the right side and 2+ on the left side.     Heart sounds: Normal heart sounds.  Pulmonary:     Effort: Pulmonary effort is normal.     Breath sounds: Normal breath sounds. No decreased breath sounds, wheezing, rhonchi or rales.  Chest:     Chest wall: Tenderness present.  Abdominal:     Palpations: Abdomen is soft.     Tenderness: There is no abdominal tenderness.  Musculoskeletal:     Cervical back: Neck supple.     Right lower leg: No tenderness. No edema.     Left lower leg: No tenderness. No edema.  Skin:    General: Skin is warm and dry.  Neurological:     Mental Status: He is alert.     ED Results / Procedures / Treatments   Labs (all labs ordered are listed, but only abnormal results are displayed) Labs Reviewed  BASIC METABOLIC PANEL - Abnormal; Notable for the following components:      Result Value   BUN 5 (*)    All other components within normal limits  CBC  TROPONIN I (HIGH SENSITIVITY)    EKG EKG Interpretation  Date/Time:  Friday November 25 2020 11:25:45 EST Ventricular Rate:  60 PR Interval:  176 QRS Duration: 100 QT Interval:  384 QTC Calculation: 384 R Axis:   70 Text Interpretation: Normal sinus rhythm with sinus arrhythmia Nonspecific T wave abnormality Abnormal ECG Confirmed by 1974 [phone]) on 11/25/2020 11:48:05  AM   Radiology DG Chest 2 View  Result Date: 11/25/2020 CLINICAL DATA:  Chest pain EXAM: CHEST - 2 VIEW COMPARISON:  10/01/2017 FINDINGS: The lungs are clear without focal pneumonia, edema, pneumothorax or pleural effusion. The cardiopericardial silhouette is within normal limits for size. The visualized bony structures of the thorax show no acute abnormality. IMPRESSION: No active cardiopulmonary disease. Electronically Signed   By: 14/01/2017 M.D.   On: 11/25/2020 11:54    Procedures Procedures   Medications Ordered in ED Medications - No data to display  ED Course  I have reviewed the triage vital signs and the nursing notes.  Pertinent labs & imaging results that were available during my care of the patient were reviewed by me and considered in my medical decision making (see chart for details).    MDM Rules/Calculators/A&P HEAR Score: 2                        25 year old male presents to the ED today complaining of intermittent substernal chest pain for the past 2 months, most recently at 5 AM this morning.  He describes it as a sharp sensation and feels like his whole body is locking up in the process now last about 15 to 17 minutes.  No chest pain currently.  Does report positive family history of CAD.  Patient without any other risk factors for CAD at this time.  He is overall well-appearing healthy male who is a never smoker.  He has no risk factors for PE at this time either.  On arrival to the ED vitals are stable.  Patient is afebrile, nontachycardic and nontachypneic.  He appears to be in no acute distress on my exam.He does have some musculature TTP along his substernal area; I suspect musculoskeletal pain more than anything else given his young age. He was screened in triage and labwork including a troponin was ordered. I do not feel pt needs additional work up at this time; if troponin negative will discharge home with PCP follow up.   EKG with nonspecific T wave  abnormality  CXR clear Troponin 3 Remainder of labs including CBC and BMP unremarkable at this time  Pt continues to be chest pain free. Do not feel he needs additional troponin at this time, heart score of 2. Will have pt follow up with PCP for same. He is given info for [facility] for PCP needs. Pt is in agreement with plan and stable for discharge home.   This note was prepared using Dragon voice recognition software and may include unintentional dictation errors due to the inherent limitations of voice recognition software.  Final Clinical Impression(s) / ED Diagnoses Final diagnoses:  Nonspecific chest pain  Chest wall pain    Rx / DC Orders ED Discharge Orders    None       Discharge Instructions     Your workup was reassuring in the ED today. Please follow up with your PCP regarding your intermittent chest pain for the past 2 months. If you do not have a PCP you can follow up with [facility] for primary care needs.   Return to the ED for any worsening symptoms       Tanda Rockers, Cordelia Poche 11/25/20 1420    Benjiman Core, MD 11/25/20 1529

## 2020-11-25 NOTE — Discharge Instructions (Addendum)
Your workup was reassuring in the ED today. Please follow up with your PCP regarding your intermittent chest pain for the past 2 months. If you do not have a PCP you can follow up with [facility] for primary care needs.   Return to the ED for any worsening symptoms

## 2020-11-25 NOTE — ED Triage Notes (Signed)
Emergency Medicine Provider Triage Evaluation Note  [facility] , a 25 y.o. male  was evaluated in triage.  Pt complains of chest pain.  Review of Systems  Positive: Sharp anterior comes and goes, not present  currently Negative: Dyspnea, cardiac history  Physical Exam  BP (!) 149/61   Pulse 60   Temp 98.8 F (37.1 C) (Oral)   Resp 18   Ht 1.753 m (5\' 9" )   Wt 81.6 kg   SpO2 99%   BMI 26.58 kg/m  Gen:   Awake, no distress   HEENT:  Atraumatic  Resp:  Normal effort  Cardiac:  Normal rate  Abd:   Nondistended, nontender  MSK:   Moves extremities without difficulty  Neuro:  Speech clear   Medical Decision Making  Medically screening exam initiated at 11:47 AM.  Appropriate orders placed.  Thurmon was informed that the remainder of the evaluation will be completed by another provider, this initial triage assessment does not replace that evaluation, and the importance of remaining in the ED until their evaluation is complete.  Clinical Impression  ED EKG without acute changes DDX- cardiac ischemia vs other causes for cardiac pain vs pe vs musculoskeletal vs great vessel etiology All low index of suspicion except ms Awaiting labs, but likely candidate for early d/c   Sheppard Coil, MD 11/25/20 1149

## 2020-11-25 NOTE — ED Triage Notes (Addendum)
Patient here with complaint of chest pain that started approximately a month and a half ago. Patient states it feels like someone is "stabbing him in the heart and taking away his breath". No dyspnea at this time.

## 2021-10-23 ENCOUNTER — Encounter (HOSPITAL_BASED_OUTPATIENT_CLINIC_OR_DEPARTMENT_OTHER): Payer: Self-pay | Admitting: *Deleted

## 2021-10-23 ENCOUNTER — Emergency Department (HOSPITAL_BASED_OUTPATIENT_CLINIC_OR_DEPARTMENT_OTHER)
Admission: EM | Admit: 2021-10-23 | Discharge: 2021-10-24 | Disposition: A | Discharge status: Home / Self Care | Payer: Self-pay | Attending: Emergency Medicine | Admitting: Emergency Medicine

## 2021-10-23 ENCOUNTER — Other Ambulatory Visit: Payer: Self-pay

## 2021-10-23 DIAGNOSIS — R197 Diarrhea, unspecified: Secondary | ICD-10-CM | POA: Insufficient documentation

## 2021-10-23 DIAGNOSIS — F17200 Nicotine dependence, unspecified, uncomplicated: Secondary | ICD-10-CM | POA: Insufficient documentation

## 2021-10-23 DIAGNOSIS — K920 Hematemesis: Secondary | ICD-10-CM | POA: Insufficient documentation

## 2021-10-23 DIAGNOSIS — R0602 Shortness of breath: Secondary | ICD-10-CM | POA: Insufficient documentation

## 2021-10-23 LAB — COMPREHENSIVE METABOLIC PANEL
ALT: 30 U/L (ref 0–44)
AST: 27 U/L (ref 15–41)
Albumin: 4.6 g/dL (ref 3.5–5.0)
Alkaline Phosphatase: 83 U/L (ref 38–126)
Anion gap: 10 (ref 5–15)
BUN: 8 mg/dL (ref 6–20)
CO2: 27 mmol/L (ref 22–32)
Calcium: 9.1 mg/dL (ref 8.9–10.3)
Chloride: 100 mmol/L (ref 98–111)
Creatinine, Ser: 1.03 mg/dL (ref 0.61–1.24)
GFR, Estimated: >60 mL/min (ref >60)
Glucose, Bld: 116 mg/dL — ABNORMAL HIGH (ref 70–99)
Potassium: 3.3 mmol/L — ABNORMAL LOW (ref 3.5–5.1)
Sodium: 137 mmol/L (ref 135–145)
Total Bilirubin: 0.8 mg/dL (ref 0.3–1.2)
Total Protein: 7.7 g/dL (ref 6.5–8.1)

## 2021-10-23 LAB — URINALYSIS, MICROSCOPIC (REFLEX): WBC, UA: NONE SEEN WBC/hpf (ref 0–5)

## 2021-10-23 LAB — URINALYSIS, ROUTINE W REFLEX MICROSCOPIC
Bilirubin Urine: NEGATIVE
Glucose, UA: NEGATIVE mg/dL
Ketones, ur: NEGATIVE mg/dL
Leukocytes,Ua: NEGATIVE
Nitrite: NEGATIVE
Protein, ur: NEGATIVE mg/dL
Specific Gravity, Urine: 1.025 (ref 1.005–1.030)
pH: 7 (ref 5.0–8.0)

## 2021-10-23 LAB — CBC
HCT: 45.3 % (ref 39.0–52.0)
Hemoglobin: 15.9 g/dL (ref 13.0–17.0)
MCH: 30.4 pg (ref 26.0–34.0)
MCHC: 35.1 g/dL (ref 30.0–36.0)
MCV: 86.6 fL (ref 80.0–100.0)
Platelets: 311 10*3/uL (ref 150–400)
RBC: 5.23 MIL/uL (ref 4.22–5.81)
RDW: 13.2 % (ref 11.5–15.5)
WBC: 9.1 10*3/uL (ref 4.0–10.5)
nRBC: 0 % (ref 0.0–0.2)

## 2021-10-23 LAB — LIPASE, BLOOD: Lipase: 37 U/L (ref 11–51)

## 2021-10-23 MED ORDER — ALUM & MAG HYDROXIDE-SIMETH 200-200-20 MG/5ML PO SUSP
30.0000 mL | Freq: Once | ORAL | Status: AC
  Administered 2021-10-24: 30 mL via ORAL
  Filled 2021-10-23: qty 30

## 2021-10-23 MED ORDER — PANTOPRAZOLE SODIUM 20 MG PO TBEC
20.0000 mg | DELAYED_RELEASE_TABLET | Freq: Once | ORAL | Status: AC
  Administered 2021-10-24: 20 mg via ORAL
  Filled 2021-10-23: qty 1

## 2021-10-23 MED ORDER — POTASSIUM CHLORIDE CRYS ER 20 MEQ PO TBCR
40.0000 meq | EXTENDED_RELEASE_TABLET | Freq: Once | ORAL | Status: AC
  Administered 2021-10-24: 40 meq via ORAL
  Filled 2021-10-23: qty 2

## 2021-10-23 NOTE — ED Triage Notes (Signed)
Vomiting a lot of blood x 6 months per pt. States he has been taking to much muscle relaxer's.

## 2021-10-23 NOTE — ED Provider Notes (Signed)
[facility] EMERGENCY DEPARTMENT Provider Note   CSN: [id number] Arrival date & time: 10/23/21  2022     History Chief Complaint  Patient presents with   Hematemesis    Willie Meyer is a 25 y.o. male.  The history is provided by the patient.  Willie Meyer is a 25 y.o. male who presents to the Emergency Department complaining of hematemesis.   Vomiting blood for six months.  Nausea every morning, vomits blood. Bright red. Turns water in toilet red.   No nose bleeds. Stomach tight for the last 21yrs, comes and goes.   Watery stools since cholecystectomy, brown stools.  Heart feels irregular for two months.  Sometimes feels like it will stop.  No chest pain.  Felt sob today.  No leg swelling or pain.  No fever.  Takes pepto, muscle relaxers. Occ ibuprofen  Family history: Htn, MS, breast ca.   Uses tobacco, rare alcohol, regular marijuana    Past Medical History:  Diagnosis Date   Cholelithiasis 08/2016   Migraine     Patient Active Problem List   Diagnosis Date Noted   Cholecystitis with cholelithiasis 09/17/2016    Past Surgical History:  Procedure Laterality Date   CHOLECYSTECTOMY N/A 09/17/2016   Procedure: LAPAROSCOPIC CHOLECYSTECTOMY;  Surgeon: Axel Filler, MD;  Location: [facility] OR;  Service: General;  Laterality: N/A;   NO PAST SURGERIES         Family History  Problem Relation Age of Onset   Multiple sclerosis Mother    Thyroid disease Mother     Social History   Tobacco Use   Smoking status: Never   Smokeless tobacco: Never  Vaping Use   Vaping Use: Never used  Substance Use Topics   Alcohol use: Yes   Drug use: Not Currently    Types: Marijuana    Home Medications Prior to Admission medications   Medication Sig Start Date End Date Taking? Authorizing Provider  ondansetron (ZOFRAN) 4 MG tablet Take 1 tablet (4 mg total) by mouth every 8 (eight) hours as needed for nausea or vomiting. 10/24/21  Yes Tilden Fossa,  MD  pantoprazole (PROTONIX) 40 MG tablet Take 1 tablet (40 mg total) by mouth daily. 10/24/21  Yes Tilden Fossa, MD  sucralfate (CARAFATE) 1 GM/10ML suspension Take 10 mLs (1 g total) by mouth 4 (four) times daily -  with meals and at bedtime. 10/24/21  Yes Tilden Fossa, MD  cyclobenzaprine (FLEXERIL) 10 MG tablet Take 1 tablet (10 mg total) by mouth 3 (three) times daily as needed for muscle spasms. 10/01/17   Azalia Bilis, MD  ibuprofen (ADVIL,MOTRIN) 600 MG tablet Take 1 tablet (600 mg total) by mouth every 8 (eight) hours as needed. 10/01/17   Azalia Bilis, MD  methocarbamol (ROBAXIN) 500 MG tablet Take 2 tablets (1,000 mg total) by mouth every 8 (eight) hours as needed for muscle spasms. 09/19/16   Meuth, Brooke A, PA-C  ondansetron (ZOFRAN-ODT) 4 MG disintegrating tablet Take 1 tablet (4 mg total) by mouth every 6 (six) hours as needed for nausea. 09/19/16   Meuth, Brooke A, PA-C  oxyCODONE (OXY IR/ROXICODONE) 5 MG immediate release tablet Take 1-2 tablets (5-10 mg total) by mouth every 6 (six) hours as needed for moderate pain. 09/19/16   Meuth, Lina Sar, PA-C    Allergies    Patient has no known allergies.  Review of Systems   Review of Systems  All other systems reviewed and are negative.  Physical Exam Updated Vital  Signs BP (!) 147/81    Pulse 76    Temp 98.4 F (36.9 C) (Oral)    Resp (!) 24    Ht 5\' 9"  (1.753 m)    Wt 88.5 kg    SpO2 99%    BMI 28.80 kg/m   Physical Exam Vitals and nursing note reviewed.  Constitutional:      Appearance: He is well-developed.  HENT:     Head: Normocephalic and atraumatic.     Nose: Nose normal. No congestion or rhinorrhea.     Mouth/Throat:     Mouth: Mucous membranes are moist.     Pharynx: No oropharyngeal exudate or posterior oropharyngeal erythema.  Cardiovascular:     Rate and Rhythm: Normal rate and regular rhythm.     Heart sounds: No murmur heard. Pulmonary:     Effort: Pulmonary effort is normal. No respiratory  distress.     Breath sounds: Normal breath sounds.  Abdominal:     Palpations: Abdomen is soft.     Tenderness: There is no abdominal tenderness. There is no guarding or rebound.  Musculoskeletal:        General: No tenderness.  Skin:    General: Skin is warm and dry.  Neurological:     Mental Status: He is alert and oriented to person, place, and time.  Psychiatric:        Behavior: Behavior normal.    ED Results / Procedures / Treatments   Labs (all labs ordered are listed, but only abnormal results are displayed) Labs Reviewed  COMPREHENSIVE METABOLIC PANEL - Abnormal; Notable for the following components:      Result Value   Potassium 3.3 (*)    Glucose, Bld 116 (*)    All other components within normal limits  URINALYSIS, ROUTINE W REFLEX MICROSCOPIC - Abnormal; Notable for the following components:   Hgb urine dipstick TRACE (*)    All other components within normal limits  URINALYSIS, MICROSCOPIC (REFLEX) - Abnormal; Notable for the following components:   Bacteria, UA RARE (*)    All other components within normal limits  LIPASE, BLOOD  CBC    EKG EKG Interpretation  Date/Time:  Tuesday October 24 2021 00:04:24 EST Ventricular Rate:  108 PR Interval:  199 QRS Duration: 88 QT Interval:  367 QTC Calculation: 492 R Axis:   51 Text Interpretation: Sinus tachycardia Borderline prolonged PR interval Probable left atrial enlargement Nonspecific T abnormalities, diffuse leads Borderline ST elevation, anterior leads Prolonged QT interval Confirmed by [birth year] [phone]) on 10/24/2021 12:06:47 AM  Radiology No results found.  Procedures Procedures   Medications Ordered in ED Medications  potassium chloride SA (KLOR-CON M) CR tablet 40 mEq (40 mEq Oral Given 10/24/21 0004)  alum & mag hydroxide-simeth (MAALOX/MYLANTA) 200-200-20 MG/5ML suspension 30 mL (30 mLs Oral Given 10/24/21 0004)  pantoprazole (PROTONIX) EC tablet 20 mg (20 mg Oral Given 10/24/21 0010)     ED Course  I have reviewed the triage vital signs and the nursing notes.  Pertinent labs & imaging results that were available during my care of the patient were reviewed by me and considered in my medical decision making (see chart for details).    MDM Rules/Calculators/A&P                         Patient here for evaluation of reports of daily bloody vomit.  He reports for years prior to this he was having yellow vomit every morning.  He states that it is bright red without clots.  This is a routine when he wakes and is not associated with nausea.  He does have intermittent abdominal pain.  On evaluation he is nontoxic-appearing and in no acute distress.  Hemoglobin is within normal limits.  Potassium is mildly low at 3.3-replaced orally.  EKG with borderline prolonged QT, doubt acute arrhythmia.  He is not having any active chest pain.  EKG also has changes consistent with LVH.  He has a family history of hypertension.  Blood pressures in the emergency department are only mildly elevated.  Discussed that he will need to follow-up with the PCP to have his blood pressure rechecked.  In terms of his recurrent daily vomiting with reports of hematemesis recommend GI follow-up.  Will prescribe PPI.  Discussed that there is concern that marijuana is contributing to his symptoms and is recommended that he discontinue marijuana use or at a minimum decrease it.  Return precautions discussed.    Final Clinical Impression(s) / ED Diagnoses Final diagnoses:  Hematemesis, unspecified whether nausea present    Rx / DC Orders ED Discharge Orders          Ordered    pantoprazole (PROTONIX) 40 MG tablet  Daily        10/24/21 0045    sucralfate (CARAFATE) 1 GM/10ML suspension  3 times daily with meals & bedtime        10/24/21 0045    ondansetron (ZOFRAN) 4 MG tablet  Every 8 hours PRN        10/24/21 0045             Tilden Fossa, MD 10/24/21 [phone]

## 2021-10-24 MED ORDER — ONDANSETRON HCL 4 MG PO TABS: 4.0000 mg | ORAL_TABLET | Freq: Three times a day (TID) | ORAL | 0 refills | Status: DC | PRN

## 2021-10-24 MED ORDER — PANTOPRAZOLE SODIUM 40 MG PO TBEC: 40.0000 mg | DELAYED_RELEASE_TABLET | Freq: Every day | ORAL | 0 refills | Status: DC

## 2021-10-24 MED ORDER — SUCRALFATE 1 GM/10ML PO SUSP: 1.0000 g | Freq: Three times a day (TID) | ORAL | 0 refills | Status: DC

## 2021-10-24 NOTE — Discharge Instructions (Signed)
Marijuana use can worsen your daily vomiting.  It is important for you to stop using marijuana.  It is also important to stop smoking.  Your blood pressure was slightly elevated today.  Please follow-up with your family doctor to have this rechecked.  Avoid ibuprofen and aspirin as this can cause worsening abdominal pain and stomach irritation.

## 2021-10-24 NOTE — ED Notes (Signed)
Patient discharged to home.  All discharge instructions reviewed.  Patient verbalized understanding via teachback method.  VS WDL.  Respirations even and unlabored.  Ambulatory out of ED.   °

## 2022-12-02 IMAGING — CR DG CHEST 2V
2 series · 2 of 2 positions shown · non-contrast
Comparison: 10/01/2017

CLINICAL DATA: Chest pain

EXAM:
CHEST - 2 VIEW

[chest pa]
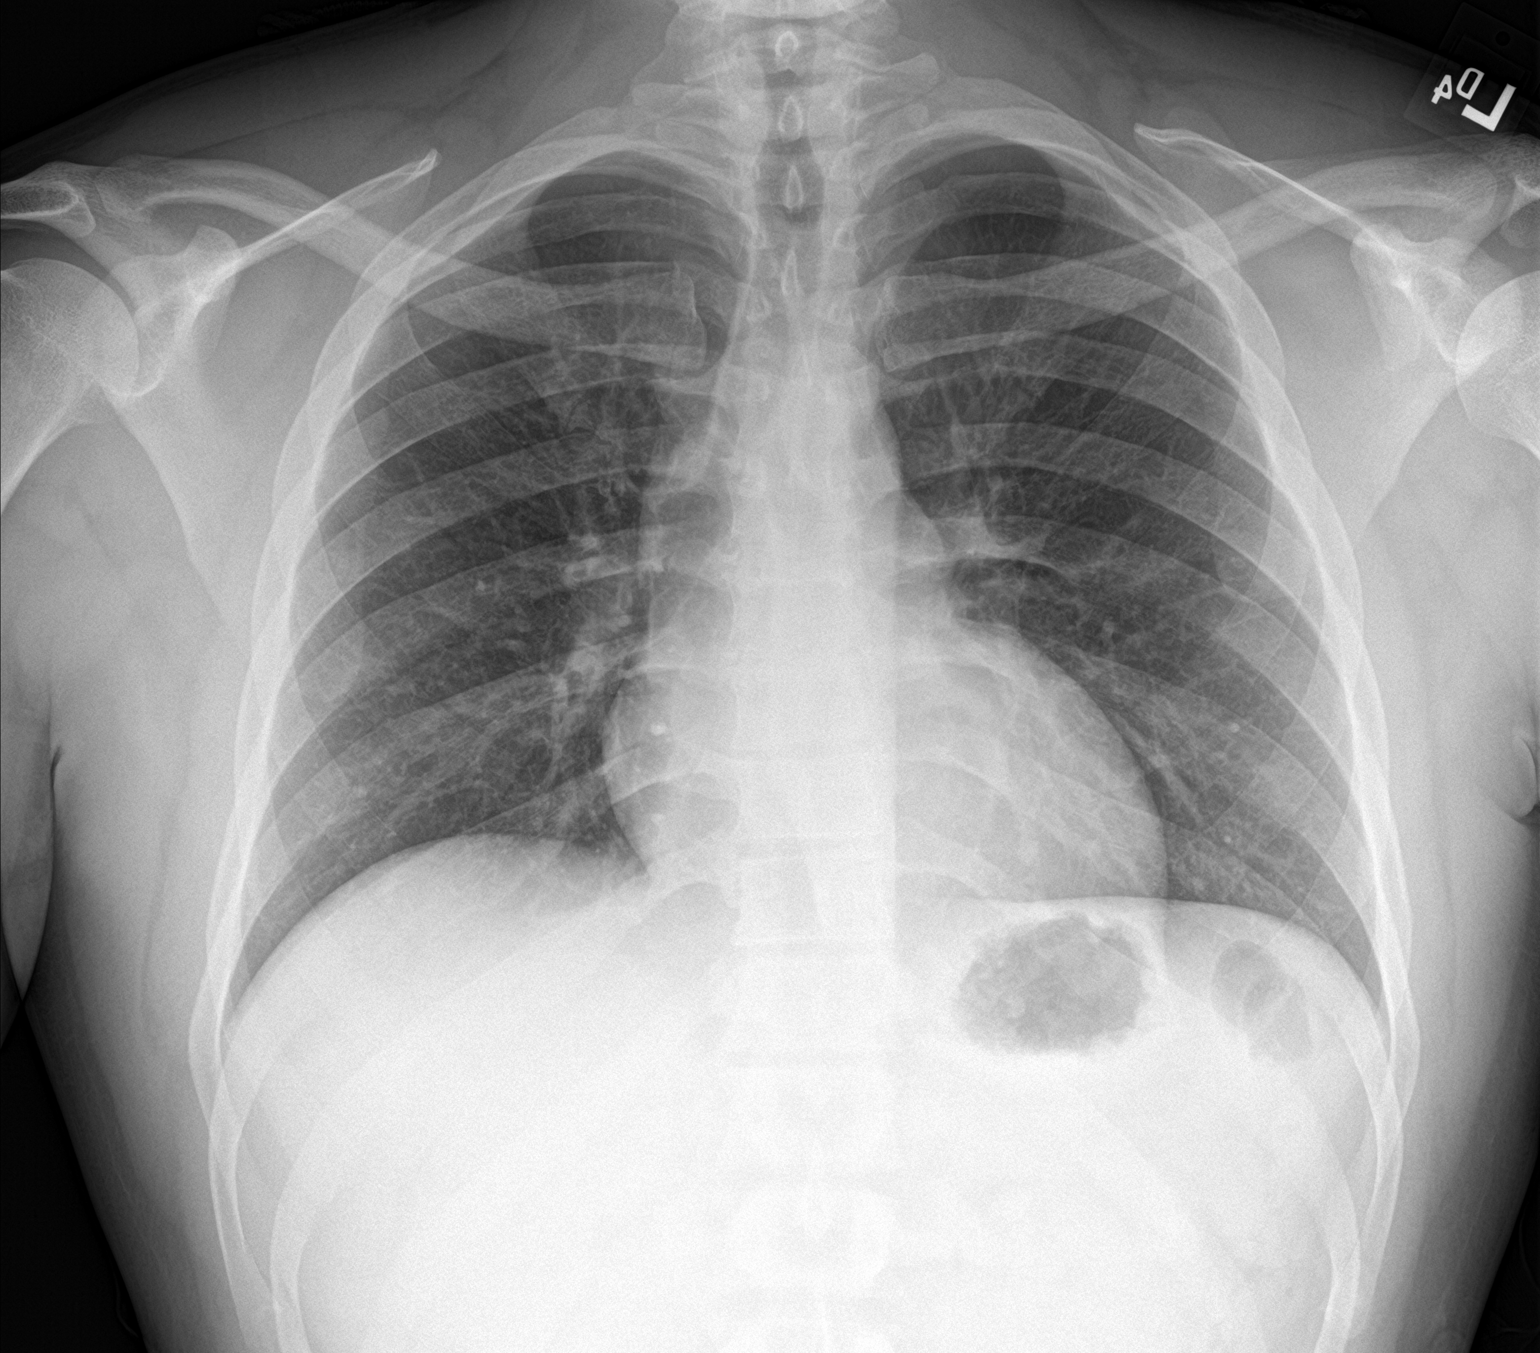

[chest lat]
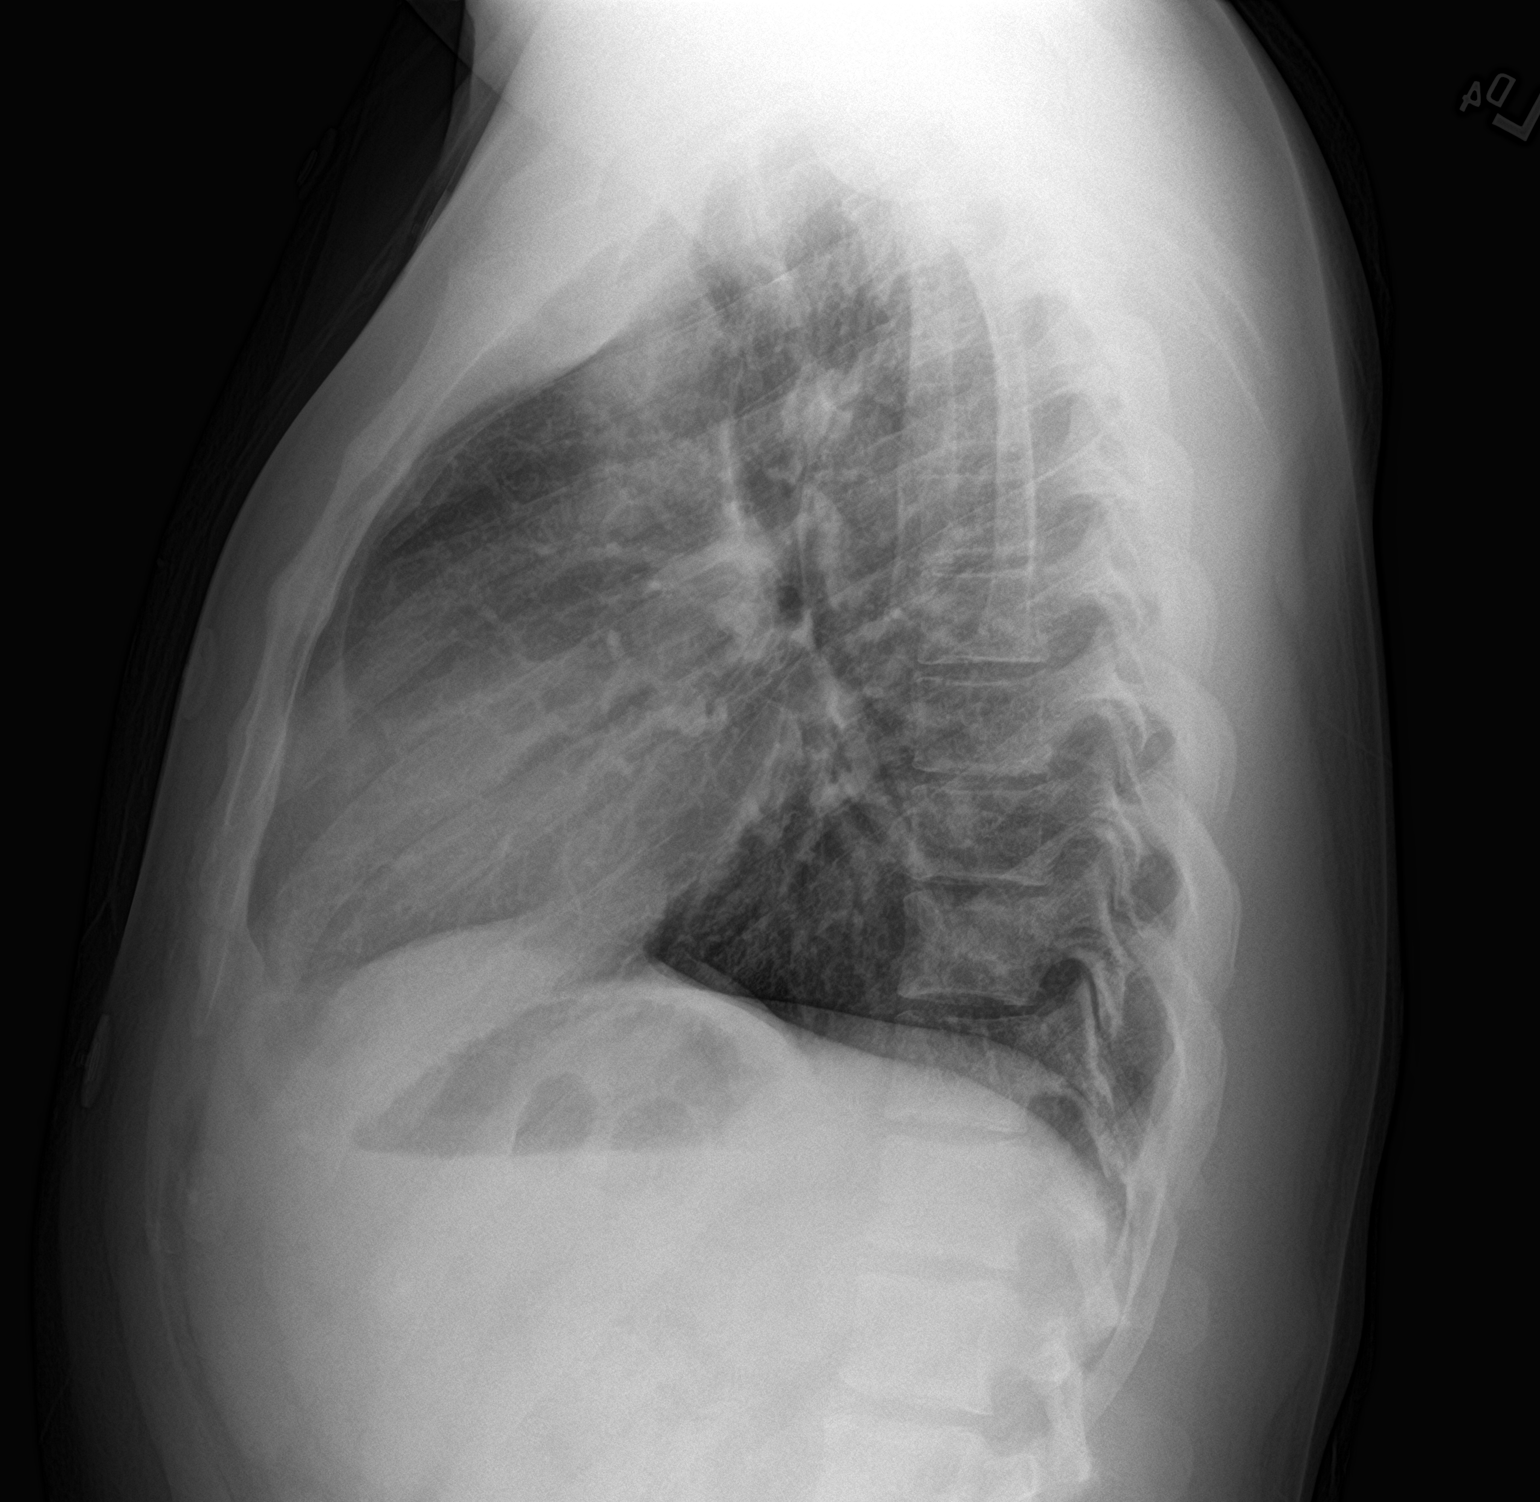

[2 of 2 positions shown; findings below may reference images not displayed]

FINDINGS: The lungs are clear without focal pneumonia, edema, pneumothorax or
pleural effusion. The cardiopericardial silhouette is within normal
limits for size. The visualized bony structures of the thorax show
no acute abnormality.
IMPRESSION: No active cardiopulmonary disease.

## 2024-03-11 ENCOUNTER — Emergency Department (HOSPITAL_COMMUNITY)
Admission: EM | Admit: 2024-03-11 | Discharge: 2024-03-13 | Disposition: A | Discharge status: Other Acute Inpatient Hospital | Payer: Self-pay | Attending: Emergency Medicine | Admitting: Emergency Medicine

## 2024-03-11 DIAGNOSIS — T426X2A Poisoning by other antiepileptic and sedative-hypnotic drugs, intentional self-harm, initial encounter: Secondary | ICD-10-CM | POA: Insufficient documentation

## 2024-03-11 DIAGNOSIS — F411 Generalized anxiety disorder: Secondary | ICD-10-CM | POA: Insufficient documentation

## 2024-03-11 DIAGNOSIS — F1210 Cannabis abuse, uncomplicated: Secondary | ICD-10-CM | POA: Diagnosis not present

## 2024-03-11 DIAGNOSIS — T43592A Poisoning by other antipsychotics and neuroleptics, intentional self-harm, initial encounter: Secondary | ICD-10-CM | POA: Diagnosis present

## 2024-03-11 DIAGNOSIS — T450X2A Poisoning by antiallergic and antiemetic drugs, intentional self-harm, initial encounter: Secondary | ICD-10-CM | POA: Diagnosis present

## 2024-03-11 DIAGNOSIS — T50902A Poisoning by unspecified drugs, medicaments and biological substances, intentional self-harm, initial encounter: Secondary | ICD-10-CM

## 2024-03-11 DIAGNOSIS — F332 Major depressive disorder, recurrent severe without psychotic features: Secondary | ICD-10-CM | POA: Insufficient documentation

## 2024-03-11 NOTE — ED Provider Notes (Signed)
 [place] EMERGENCY DEPARTMENT AT [facility] Provider Note   CSN: [id number] Arrival date & time: 03/11/24  2340     History {Add pertinent medical, surgical, social history, OB history to HPI:1} Chief Complaint  Patient presents with   Drug Overdose    Willie Meyer is a 28 y.o. male.  The history is provided by the patient and medical records.  Drug Overdose     Home Medications Prior to Admission medications   Medication Sig Start Date End Date Taking? Authorizing Provider  cyclobenzaprine  (FLEXERIL ) 10 MG tablet Take 1 tablet (10 mg total) by mouth 3 (three) times daily as needed for muscle spasms. 10/01/17   Nannette Babe, MD  ibuprofen  (ADVIL ,MOTRIN ) 600 MG tablet Take 1 tablet (600 mg total) by mouth every 8 (eight) hours as needed. 10/01/17   Nannette Babe, MD  methocarbamol  (ROBAXIN ) 500 MG tablet Take 2 tablets (1,000 mg total) by mouth every 8 (eight) hours as needed for muscle spasms. 09/19/16   Meuth, Quillian Brunt, PA-C  ondansetron  (ZOFRAN ) 4 MG tablet Take 1 tablet (4 mg total) by mouth every 8 (eight) hours as needed for nausea or vomiting. 10/24/21   Kelsey Patricia, MD  ondansetron  (ZOFRAN -ODT) 4 MG disintegrating tablet Take 1 tablet (4 mg total) by mouth every 6 (six) hours as needed for nausea. 09/19/16   Meuth, Brooke A, PA-C  oxyCODONE  (OXY IR/ROXICODONE ) 5 MG immediate release tablet Take 1-2 tablets (5-10 mg total) by mouth every 6 (six) hours as needed for moderate pain. 09/19/16   Meuth, Brooke A, PA-C  pantoprazole  (PROTONIX ) 40 MG tablet Take 1 tablet (40 mg total) by mouth daily. 10/24/21   Kelsey Patricia, MD  sucralfate  (CARAFATE ) 1 GM/10ML suspension Take 10 mLs (1 g total) by mouth 4 (four) times daily -  with meals and at bedtime. 10/24/21   Kelsey Patricia, MD      Allergies    Patient has no known allergies.    Review of Systems   Review of Systems  Physical Exam Updated Vital Signs There were no vitals taken for this  visit. Physical Exam  ED Results / Procedures / Treatments   Labs (all labs ordered are listed, but only abnormal results are displayed) Labs Reviewed  CBC WITH DIFFERENTIAL/PLATELET  COMPREHENSIVE METABOLIC PANEL WITH GFR  ETHANOL  RAPID URINE DRUG SCREEN, HOSP PERFORMED  SALICYLATE LEVEL  ACETAMINOPHEN  LEVEL    EKG None  Radiology No results found.  Procedures Procedures  {Document cardiac monitor, telemetry assessment procedure when appropriate:1}  CRITICAL CARE Performed by: Coretha Dew   Total critical care time: *** minutes  Critical care time was exclusive of separately billable procedures and treating other patients.  Critical care was necessary to treat or prevent imminent or life-threatening deterioration.  Critical care was time spent personally by me on the following activities: development of treatment plan with patient and/or surrogate as well as nursing, discussions with consultants, evaluation of patient's response to treatment, examination of patient, obtaining history from patient or surrogate, ordering and performing treatments and interventions, ordering and review of laboratory studies, ordering and review of radiographic studies, pulse oximetry and re-evaluation of patient's condition.   Medications Ordered in ED Medications - No data to display  ED Course/ Medical Decision Making/ A&P   {   Click here for ABCD2, HEART and other calculatorsREFRESH Note before signing :1}  Medical Decision Making Amount and/or Complexity of Data Reviewed Labs: ordered. ECG/medicine tests: ordered and independent interpretation performed.   ***  11:49 PM Spoke with poison control-- expected to be symptomatic at this dose.  Monitor for HTN, hyperthermia, urinary retention, QRS widening, hallucinations.  EKG repeat in 6 hours or if becoming increasingly symptomatic.  If QRS >120 Na+ bicarb 1-58meq/kg dosing. Sometimes seizures in  larger ingestions-- benzo's in heavier doses if needed, barbiturates 2nd line.  Recommended 6 hours observation but need to ensure back to baseline  Final Clinical Impression(s) / ED Diagnoses Final diagnoses:  None    Rx / DC Orders ED Discharge Orders     None

## 2024-03-11 NOTE — ED Triage Notes (Addendum)
 BIB [facility] for Diphenhydramine overdose, Per ems patient reportedly took 50 tablets of Diphenhydramine HCL 25mg , "to sleep" per ems report. Patient only responding to pain, Respirations even and unlabored, 99% on room air. family last spoke to patient @ 2140, EMS called at 2310. Hx of depression and insomnia.

## 2024-03-12 ENCOUNTER — Encounter (HOSPITAL_COMMUNITY): Payer: Self-pay | Admitting: Psychiatry

## 2024-03-12 DIAGNOSIS — T43592A Poisoning by other antipsychotics and neuroleptics, intentional self-harm, initial encounter: Secondary | ICD-10-CM | POA: Diagnosis present

## 2024-03-12 DIAGNOSIS — F332 Major depressive disorder, recurrent severe without psychotic features: Secondary | ICD-10-CM

## 2024-03-12 DIAGNOSIS — F1210 Cannabis abuse, uncomplicated: Secondary | ICD-10-CM | POA: Diagnosis not present

## 2024-03-12 DIAGNOSIS — F411 Generalized anxiety disorder: Secondary | ICD-10-CM

## 2024-03-12 LAB — COMPREHENSIVE METABOLIC PANEL WITH GFR
ALT: 29 U/L (ref 0–44)
AST: 26 U/L (ref 15–41)
Albumin: 4.1 g/dL (ref 3.5–5.0)
Alkaline Phosphatase: 84 U/L (ref 38–126)
Anion gap: 11 (ref 5–15)
BUN: 5 mg/dL — ABNORMAL LOW (ref 6–20)
CO2: 24 mmol/L (ref 22–32)
Calcium: 9.8 mg/dL (ref 8.9–10.3)
Chloride: 104 mmol/L (ref 98–111)
Creatinine, Ser: 1.07 mg/dL (ref 0.61–1.24)
GFR, Estimated: >60 mL/min (ref >60)
Glucose, Bld: 97 mg/dL (ref 70–99)
Potassium: 3.9 mmol/L (ref 3.5–5.1)
Sodium: 139 mmol/L (ref 135–145)
Total Bilirubin: 1.1 mg/dL (ref 0.0–1.2)
Total Protein: 7.5 g/dL (ref 6.5–8.1)

## 2024-03-12 LAB — RAPID URINE DRUG SCREEN, HOSP PERFORMED
Amphetamines: NOT DETECTED
Barbiturates: NOT DETECTED
Benzodiazepines: NOT DETECTED
Cocaine: NOT DETECTED
Opiates: NOT DETECTED
Tetrahydrocannabinol: POSITIVE — AB

## 2024-03-12 LAB — CBC WITH DIFFERENTIAL/PLATELET
Abs Immature Granulocytes: 0.03 10*3/uL (ref 0.00–0.07)
Basophils Absolute: 0 10*3/uL (ref 0.0–0.1)
Basophils Relative: 0 %
Eosinophils Absolute: 0.1 10*3/uL (ref 0.0–0.5)
Eosinophils Relative: 1 %
HCT: 47 % (ref 39.0–52.0)
Hemoglobin: 15.9 g/dL (ref 13.0–17.0)
Immature Granulocytes: 0 %
Lymphocytes Relative: 31 %
Lymphs Abs: 2.7 10*3/uL (ref 0.7–4.0)
MCH: 29.8 pg (ref 26.0–34.0)
MCHC: 33.8 g/dL (ref 30.0–36.0)
MCV: 88.2 fL (ref 80.0–100.0)
Monocytes Absolute: 0.8 10*3/uL (ref 0.1–1.0)
Monocytes Relative: 9 %
Neutro Abs: 5.2 10*3/uL (ref 1.7–7.7)
Neutrophils Relative %: 59 %
Platelets: 316 10*3/uL (ref 150–400)
RBC: 5.33 MIL/uL (ref 4.22–5.81)
RDW: 14.6 % (ref 11.5–15.5)
WBC: 8.8 10*3/uL (ref 4.0–10.5)
nRBC: 0 % (ref 0.0–0.2)

## 2024-03-12 LAB — ETHANOL: Alcohol, Ethyl (B): <15 mg/dL (ref <15)

## 2024-03-12 LAB — SALICYLATE LEVEL: Salicylate Lvl: <7 mg/dL — ABNORMAL LOW (ref 7.0–30.0)

## 2024-03-12 LAB — ACETAMINOPHEN LEVEL: Acetaminophen (Tylenol), Serum: <10 ug/mL — ABNORMAL LOW (ref 10–30)

## 2024-03-12 MED ORDER — IBUPROFEN 800 MG PO TABS
800.0000 mg | ORAL_TABLET | Freq: Once | ORAL | Status: AC
  Administered 2024-03-12: 800 mg via ORAL
  Filled 2024-03-12: qty 1

## 2024-03-12 MED ORDER — DIPHENHYDRAMINE HCL 50 MG/ML IJ SOLN: 25.0000 mg | Freq: Once | INTRAMUSCULAR | Status: DC

## 2024-03-12 MED ORDER — KETOROLAC TROMETHAMINE 30 MG/ML IJ SOLN
30.0000 mg | Freq: Once | INTRAMUSCULAR | Status: AC
  Administered 2024-03-12: 30 mg via INTRAVENOUS
  Filled 2024-03-12: qty 1

## 2024-03-12 MED ORDER — MIDAZOLAM HCL (PF) 10 MG/2ML IJ SOLN: 5.0000 mg | Freq: Once | INTRAMUSCULAR | Status: DC

## 2024-03-12 MED ORDER — MELATONIN 3 MG PO TABS
3.0000 mg | ORAL_TABLET | Freq: Every day | ORAL | Status: DC
  Administered 2024-03-12: 3 mg via ORAL
  Filled 2024-03-12: qty 1

## 2024-03-12 MED ORDER — DROPERIDOL 2.5 MG/ML IJ SOLN: 2.5000 mg | Freq: Once | INTRAMUSCULAR | Status: DC

## 2024-03-12 MED ORDER — ONDANSETRON HCL 4 MG/2ML IJ SOLN
4.0000 mg | Freq: Once | INTRAMUSCULAR | Status: AC
  Administered 2024-03-12: 4 mg via INTRAVENOUS
  Filled 2024-03-12: qty 2

## 2024-03-12 MED ORDER — MIDAZOLAM HCL (PF) 10 MG/2ML IJ SOLN
INTRAMUSCULAR | Status: AC
  Filled 2024-03-12: qty 2

## 2024-03-12 MED ORDER — DIPHENHYDRAMINE HCL 50 MG/ML IJ SOLN
INTRAMUSCULAR | Status: AC
Start: 2024-03-12 — End: 2024-03-12
  Filled 2024-03-12: qty 1

## 2024-03-12 MED ORDER — VENLAFAXINE HCL ER 75 MG PO CP24
75.0000 mg | ORAL_CAPSULE | Freq: Every day | ORAL | Status: DC
  Administered 2024-03-13: 75 mg via ORAL
  Filled 2024-03-12 (×2): qty 1

## 2024-03-12 NOTE — ED Notes (Signed)
 Called [place] [facility] to tx pt to [facility] left a message and the phone number  [phone] or [phone]

## 2024-03-12 NOTE — ED Notes (Signed)
 Pt continues to remain agitated with RN Fawn Hooks and PA Edwina Gram about treatment. RN and PA have explained multiple times the procedure for his situation. Security alerted and at bedside.

## 2024-03-12 NOTE — ED Notes (Signed)
 Notified PA Edwina Gram that patient is actively vomiting, patient also complaining of restless legs. (Medications ordered see MAR)

## 2024-03-12 NOTE — ED Notes (Signed)
 Charge nurse Bridgette Campus notified of patient need for a 1:1 sitter for suicide watch.

## 2024-03-12 NOTE — ED Notes (Signed)
 Patient stated to this RN that he, "wanted to see my grandmother." When asked why he stated, " I can only see her when I'm in a deep sleep." Patient also intermittently alert and asking to, " call my girlfriend." Patient mumbling speech and lethargic. PA Edwina Gram notified of situation.

## 2024-03-12 NOTE — ED Notes (Signed)
 [facility] paged x2. Awaiting a return phone call
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
## 2024-03-12 NOTE — ED Notes (Signed)
 Patients mother at bedside, Patient's mother showing text messages to this RN where patient talks about, "checking out." Mother concerned patient tried to end his life. Patient intermittently alert, patient requesting his mother to "call my girlfriend." When alert patient attempting to leave, and is yelling at mother, ripping off lines and devices. PA Edwina Gram notified, Security notified. Patients mother left room. Patient now appears to be sleeping with eyes closed, respirations even and unlabored. (Vitals in chart.)

## 2024-03-12 NOTE — Progress Notes (Signed)
 Pt has been accepted to [facility] TODAY  03/12/2024  Bed assignment: Main campus  Pt meets inpatient criteria per: Heyward Loud NP  Attending Physician will be Lavona Pounds, MD  Report can be called to: [phone] (this is a pager, please leave call-back number when giving report)  Pt can arrive ASAP   Care Team Notified: Heyward Loud NP, Anda Bamberg RN  [place] Angelyn Osterberg LCSW-A   03/12/2024 2:53 PM

## 2024-03-12 NOTE — ED Notes (Signed)
 Called staffing/sitters, Patient needing 1:1 sitter, Sitter not available at this time per phone call. Patient is in front of the nurses station with door wide open.

## 2024-03-12 NOTE — ED Notes (Signed)
 Poison control called spoke with Veto Gowda. Read the results of repeat EKG, lab work, Medications given and patient status. Patient is cleared medically for psychiatric services per phone conversation with Veto Gowda.

## 2024-03-12 NOTE — ED Notes (Signed)
 Report called to Channie Comings RN at [facility].

## 2024-03-12 NOTE — Progress Notes (Signed)
 LCSW Progress Note  [id number]   Corneilus Taevon Sendra  03/12/2024  2:32 PM  Description:   Inpatient Psychiatric Referral  Patient was recommended inpatient per Heyward Loud NP. There are no available beds at [facility], per [facility] Bevin Bucks RN. Patient was referred to the following out of network facilities:   Destination  Service Provider Address Phone Fax  [facility] [address] [place] [address] [phone]  [facility] [address] [place] [address] [phone]  [facility] EFAX [address] [place], [place] [phone]  [facility] [address] [place] [address] [phone]  [facility] [address] [place] [address] [phone]      Situation ongoing, CSW to continue following and update chart as more information becomes available.      [place] Kearstyn Avitia, MSW, LCSW  03/12/2024 2:32 PM

## 2024-03-12 NOTE — ED Provider Notes (Addendum)
 Pt was agitated following discussion of IVC process  Was able to de-escalate  Hold acute meds for now, feeling better.        Teddi Favors, DO 03/12/24 1157

## 2024-03-13 NOTE — ED Provider Notes (Signed)
 Emergency Medicine Observation Re-evaluation Note  Willie Meyer is a 28 y.o. male, seen on rounds today.  Pt initially presented to the ED for complaints of Drug Overdose Currently, the patient is sleeping.  Physical Exam  BP 133/75 (BP Location: Right Arm)   Pulse 62   Temp 98 F (36.7 C) (Oral)   Resp 20   SpO2 100%  Physical Exam General: nad Cardiac: regular Lungs: no acute distress Psych: agitated overnight   ED Course / MDM  EKG:EKG Interpretation Date/Time:  Thursday Mar 12 2024 06:16:25 EDT Ventricular Rate:  82 PR Interval:  191 QRS Duration:  87 QT Interval:  344 QTC Calculation: 402 R Axis:   65  Text Interpretation: Sinus rhythm Probable left atrial enlargement Borderline T abnormalities, diffuse leads No significant change since last tracing Confirmed by Celesta Coke ([id number]) on 03/12/2024 7:00:51 AM  I have reviewed the labs performed to date as well as medications administered while in observation.  Recent changes in the last 24 hours include required meds overnight due to agitation.  Plan  Current plan is for under IVC going to [facility] this morning.    Almond Army, MD 03/13/24 [id number]

## 2024-03-13 NOTE — ED Notes (Signed)
 Patient in bed with eyes closed, no acute distress noted. Sitter at bedside, will continue to monitor.

## 2024-05-10 ENCOUNTER — Emergency Department (HOSPITAL_COMMUNITY)

## 2024-05-10 ENCOUNTER — Emergency Department (HOSPITAL_COMMUNITY)
Admission: EM | Admit: 2024-05-10 | Discharge: 2024-05-10 | Disposition: A | Discharge status: Home / Self Care | Attending: Emergency Medicine | Admitting: Emergency Medicine

## 2024-05-10 ENCOUNTER — Encounter (HOSPITAL_COMMUNITY): Payer: Self-pay | Admitting: *Deleted

## 2024-05-10 ENCOUNTER — Other Ambulatory Visit: Payer: Self-pay

## 2024-05-10 DIAGNOSIS — S01111A Laceration without foreign body of right eyelid and periocular area, initial encounter: Secondary | ICD-10-CM | POA: Diagnosis not present

## 2024-05-10 DIAGNOSIS — W208XXA Other cause of strike by thrown, projected or falling object, initial encounter: Secondary | ICD-10-CM | POA: Diagnosis not present

## 2024-05-10 DIAGNOSIS — S0181XA Laceration without foreign body of other part of head, initial encounter: Secondary | ICD-10-CM

## 2024-05-10 DIAGNOSIS — S0993XA Unspecified injury of face, initial encounter: Secondary | ICD-10-CM | POA: Diagnosis present

## 2024-05-10 LAB — CBC WITH DIFFERENTIAL/PLATELET
Abs Immature Granulocytes: 0.1 K/uL — ABNORMAL HIGH (ref 0.00–0.07)
Basophils Absolute: 0 K/uL (ref 0.0–0.1)
Basophils Relative: 0 %
Eosinophils Absolute: 0 K/uL (ref 0.0–0.5)
Eosinophils Relative: 0 %
HCT: 47.6 % (ref 39.0–52.0)
Hemoglobin: 16 g/dL (ref 13.0–17.0)
Immature Granulocytes: 1 %
Lymphocytes Relative: 9 %
Lymphs Abs: 1.3 K/uL (ref 0.7–4.0)
MCH: 29.7 pg (ref 26.0–34.0)
MCHC: 33.6 g/dL (ref 30.0–36.0)
MCV: 88.3 fL (ref 80.0–100.0)
Monocytes Absolute: 1 K/uL (ref 0.1–1.0)
Monocytes Relative: 6 %
Neutro Abs: 12.8 K/uL — ABNORMAL HIGH (ref 1.7–7.7)
Neutrophils Relative %: 84 %
Platelets: 314 K/uL (ref 150–400)
RBC: 5.39 MIL/uL (ref 4.22–5.81)
RDW: 13.4 % (ref 11.5–15.5)
WBC: 15.3 K/uL — ABNORMAL HIGH (ref 4.0–10.5)
nRBC: 0 % (ref 0.0–0.2)

## 2024-05-10 LAB — BASIC METABOLIC PANEL WITH GFR
Anion gap: 12 (ref 5–15)
BUN: 7 mg/dL (ref 6–20)
CO2: 21 mmol/L — ABNORMAL LOW (ref 22–32)
Calcium: 9.4 mg/dL (ref 8.9–10.3)
Chloride: 104 mmol/L (ref 98–111)
Creatinine, Ser: 1.27 mg/dL — ABNORMAL HIGH (ref 0.61–1.24)
GFR, Estimated: >60 mL/min (ref >60)
Glucose, Bld: 101 mg/dL — ABNORMAL HIGH (ref 70–99)
Potassium: 3.5 mmol/L (ref 3.5–5.1)
Sodium: 137 mmol/L (ref 135–145)

## 2024-05-10 MED ORDER — HYDROCODONE-ACETAMINOPHEN 5-325 MG PO TABS
1.0000 | ORAL_TABLET | Freq: Once | ORAL | Status: AC
  Administered 2024-05-10: 1 via ORAL
  Filled 2024-05-10: qty 1
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
## 2024-05-10 NOTE — ED Provider Triage Note (Signed)
 Emergency Medicine Provider Triage Evaluation Note  Willie Meyer , a 28 y.o. male  was evaluated in triage.  Pt complains of injury to the left eye.  About 1-1/2 hours ago.  Laceration as well as localized edema.  There is tenderness all around the orbit.  Review of Systems  Positive: As above Negative: As above  Physical Exam  Ht 5' 9 (1.753 m)   Wt 88.5 kg   BMI 28.81 kg/m  Gen:   Awake, no distress   Resp:  Normal effort  MSK:   Moves extremities without difficulty  Other:    Medical Decision Making  Medically screening exam initiated at 7:53 PM.  Appropriate orders placed.  Willie Meyer was informed that the remainder of the evaluation will be completed by another provider, this initial triage assessment does not replace that evaluation, and the importance of remaining in the ED until their evaluation is complete.     Willie Loge, PA-C 05/10/24 1954

## 2024-05-10 NOTE — ED Provider Notes (Signed)
 [facility] EMERGENCY DEPARTMENT AT [facility] Provider Note   CSN: [id number] Arrival date & time: 05/10/24  1943   Patient presents with: Facial Laceration   Willie Meyer is a 28 y.o. male with past medical history of GAD and MDD who presents for evaluation of laceration below his left eyebrow.  Patient states that he was messing with something on a shelf when a metal iron fell and struck him on his face just lateral to and above his left eye.  He states that it bled a bit at first but stopped bleeding when he applied pressure, states that the injury occurred roughly 1.5 hours ago.  Denies any LOC.  Denies significant headache, blurred vision, or limited movements of his left eyeball, but does note that he previously broke his left orbit and has some pain with movement of the left eye currently.  States that he last had a Tdap vaccine within the last 2 years.   Prior to Admission medications   Not on File    Allergies: Patient has no known allergies.     Updated Vital Signs BP 137/63 (BP Location: Right Arm)   Pulse 72   Temp 99.6 F (37.6 C)   Resp 18   Ht 5' 9 (1.753 m)   Wt 88.5 kg   SpO2 99%   BMI 28.81 kg/m   Physical Exam Vitals reviewed.  Constitutional:      General: He is not in acute distress.    Appearance: He is not toxic-appearing or diaphoretic.  HENT:     Head: Normocephalic.     Comments: 2cm superficial lac inferolateral to the L eyebrow (photo below)    Nose: Nose normal. No rhinorrhea.     Mouth/Throat:     Mouth: Mucous membranes are moist.     Pharynx: Oropharynx is clear.  Eyes:     General: Vision grossly intact. Gaze aligned appropriately.        Right eye: No foreign body.        Left eye: No foreign body.     Extraocular Movements: Extraocular movements intact.     Conjunctiva/sclera:     Right eye: No chemosis.    Left eye: No chemosis.    Pupils: Pupils are equal, round, and reactive to light.     Comments: Small  left temporal conjunctival hemorrhage. Left periorbital edema and tenderness, mild bruising. No laceration of the eyelid  Cardiovascular:     Rate and Rhythm: Normal rate and regular rhythm.     Heart sounds: No murmur heard.    No gallop.  Pulmonary:     Effort: Pulmonary effort is normal. No respiratory distress.  Musculoskeletal:     Cervical back: Normal range of motion and neck supple. No tenderness.  Skin:    General: Skin is warm and dry.     Findings: Lesion (facial laceration as above) present.  Neurological:     General: No focal deficit present.     Mental Status: He is alert and oriented to person, place, and time. Mental status is at baseline.     Cranial Nerves: No cranial nerve deficit.     Gait: Gait normal.     (all labs ordered are listed, but only abnormal results are displayed) Labs Reviewed  CBC WITH DIFFERENTIAL/PLATELET - Abnormal; Notable for the following components:      Result Value   WBC 15.3 (*)    Neutro Abs 12.8 (*)    Abs Immature  Granulocytes 0.10 (*)    All other components within normal limits  BASIC METABOLIC PANEL WITH GFR - Abnormal; Notable for the following components:   CO2 21 (*)    Glucose, Bld 101 (*)    Creatinine, Ser 1.27 (*)    All other components within normal limits    EKG: None  Radiology: CT Orbits Wo Contrast Result Date: 05/10/2024 CLINICAL DATA:  Orbital trauma.  Facial laceration. EXAM: CT ORBITS WITHOUT CONTRAST TECHNIQUE: Multidetector CT imaging of the orbits was performed using the standard protocol without intravenous contrast. Multiplanar CT image reconstructions were also generated. RADIATION DOSE REDUCTION: This exam was performed according to the departmental dose-optimization program which includes automated exposure control, adjustment of the mA and/or kV according to patient size and/or use of iterative reconstruction technique. COMPARISON:  CT head 04/27/2015 FINDINGS: Orbits: No orbital mass. Normal  appearance of the globes, optic nerve-sheath complexes, extraocular muscles, orbital fat and lacrimal glands. Left periorbital contusion and hematoma. Visible paranasal sinuses: Cl mucosal thickening in the ethmoid air cells, maxillary sinuses, and sphenoid sinuses. Soft tissues: Left periorbital and left temporal scalp contusions/hematomas. Osseous: No acute fracture. Limited intracranial: No acute or significant finding. IMPRESSION: Left periorbital and left temporal scalp contusions/hematomas. Globes are intact. No postseptal hematoma. Electronically Signed   By: Norman Gatlin M.D.   On: 05/10/2024 21:16     .Laceration Repair  Date/Time: 05/10/2024 10:05 PM  Performed by: Raoul Rake, MD Authorized by: Garrick Charleston, MD   Consent:    Consent obtained:  Verbal   Consent given by:  Patient   Risks, benefits, and alternatives were discussed: yes     Risks discussed:  Infection, poor cosmetic result, poor wound healing and pain Laceration details:    Location:  Face   Face location:  L eyebrow   Length (cm):  2 Exploration:    Hemostasis achieved with:  Direct pressure   Contaminated: no   Treatment:    Area cleansed with:  Povidone-iodine   Amount of cleaning:  Standard   Visualized foreign bodies/material removed: no     Debridement:  None Skin repair:    Repair method:  Steri-Strips   Number of Steri-Strips:  4 (thin steri strips) Approximation:    Approximation:  Close Repair type:    Repair type:  Simple Post-procedure details:    Dressing:  Antibiotic ointment   Procedure completion:  Tolerated well, no immediate complications    Medications Ordered in the ED  HYDROcodone -acetaminophen  (NORCO/VICODIN) 5-325 MG per tablet 1 tablet (1 tablet Oral Given 05/10/24 2224)    Clinical Course as of 05/10/24 2229  Sun May 10, 2024  2144 CT Orbits Wo Contrast Left periorbital and left temporal scalp contusions/hematomas. Globes are intact. No postseptal  hematoma.   [AD]  2229 WBC(!): 15.3 Likely reactive, no infectious signs on exam or history per patient [AD]  2229 Creatinine(!): 1.27 Baseline 1.0-1.1 [AD]    Clinical Course User Index [AD] Raoul Rake, MD    Medical Decision Making Patient with the above history presenting with 2 cm superficial laceration inferolateral to the left eyebrow with some swelling and mild bruising of the left periorbit.  He has a small left temporal conjunctival hemorrhage but otherwise has preserved EOM, normal pupil exam, and no noted foreign body of the eye. CT orbits as ordered in triage negative for acute fracture, muscle entrapment, or postseptal hematoma.  Patient UTD on Tdap.  Will give dose of Norco in ED and close superficial lac  with Steri-Strips. Patient tolerated wound repair.  Patient requesting eyepatch for the left eye which was provided.  Patient given strict return precautions and discharged in stable condition.  Amount and/or Complexity of Data Reviewed Labs: ordered. Decision-making details documented in ED Course. Radiology: ordered. Decision-making details documented in ED Course.  Risk Prescription drug management.    Final diagnoses:  Facial laceration, initial encounter    ED Discharge Orders     None          Raoul Rake, MD 05/10/24 2230    Garrick Charleston, MD 05/10/24 [phone]

## 2024-05-10 NOTE — Discharge Instructions (Addendum)
 You were seen today for injury above the left eye with laceration. While you were here we monitored your vitals, preformed a physical exam, and got CT scan. These were all reassuring and there is no indication for any further testing or intervention in the emergency department at this time.   Things to do:  - Follow up with your primary care provider within the next 1-2 weeks as needed - Keep your wound clean and dry, allow the steri-strips to fall off naturally, avoid submerging your head or scrubbing/scratching at the wound to avoid pulling off the steri-strips  Return to the emergency department if you have any new or worsening symptoms, or if you have any other serious concerns.
